# Patient Record
Sex: Female | Born: 1950
Health system: Southern US, Community
[De-identification: ages and names within clinical notes are randomized; demographics above are authoritative.]

## PROBLEM LIST (undated history)

## (undated) DIAGNOSIS — I10 Essential (primary) hypertension: Secondary | ICD-10-CM

## (undated) DIAGNOSIS — F32A Depression, unspecified: Secondary | ICD-10-CM

## (undated) DIAGNOSIS — F329 Major depressive disorder, single episode, unspecified: Secondary | ICD-10-CM

## (undated) DIAGNOSIS — D649 Anemia, unspecified: Secondary | ICD-10-CM

## (undated) DIAGNOSIS — D0510 Intraductal carcinoma in situ of unspecified breast: Secondary | ICD-10-CM

## (undated) DIAGNOSIS — C801 Malignant (primary) neoplasm, unspecified: Secondary | ICD-10-CM

## (undated) DIAGNOSIS — G629 Polyneuropathy, unspecified: Secondary | ICD-10-CM

## (undated) DIAGNOSIS — Z780 Asymptomatic menopausal state: Secondary | ICD-10-CM

## (undated) DIAGNOSIS — C50919 Malignant neoplasm of unspecified site of unspecified female breast: Secondary | ICD-10-CM

## (undated) DIAGNOSIS — K219 Gastro-esophageal reflux disease without esophagitis: Secondary | ICD-10-CM

## (undated) DIAGNOSIS — I341 Nonrheumatic mitral (valve) prolapse: Secondary | ICD-10-CM

## (undated) HISTORY — DX: Intraductal carcinoma in situ of unspecified breast: D05.10

## (undated) HISTORY — PX: BUNIONECTOMY: SHX129

## (undated) HISTORY — DX: Depression, unspecified: F32.A

## (undated) HISTORY — DX: Nonrheumatic mitral (valve) prolapse: I34.1

## (undated) HISTORY — DX: Major depressive disorder, single episode, unspecified: F32.9

## (undated) HISTORY — PX: ABDOMINAL HYSTERECTOMY: SHX81

## (undated) HISTORY — PX: GANGLION CYST EXCISION: SHX1691

## (undated) HISTORY — DX: Asymptomatic menopausal state: Z78.0

## (undated) HISTORY — DX: Essential (primary) hypertension: I10

---

## 2005-06-08 ENCOUNTER — Ambulatory Visit: Payer: Self-pay | Admitting: Internal Medicine

## 2006-05-29 ENCOUNTER — Ambulatory Visit: Payer: Self-pay | Admitting: Gastroenterology

## 2006-07-05 ENCOUNTER — Ambulatory Visit: Payer: Self-pay | Admitting: Internal Medicine

## 2007-09-12 ENCOUNTER — Ambulatory Visit: Payer: Self-pay | Admitting: Internal Medicine

## 2007-12-03 ENCOUNTER — Ambulatory Visit: Payer: Self-pay | Admitting: Gastroenterology

## 2007-12-04 ENCOUNTER — Ambulatory Visit: Payer: Self-pay | Admitting: Gastroenterology

## 2012-08-08 ENCOUNTER — Ambulatory Visit: Payer: Self-pay

## 2013-01-01 ENCOUNTER — Ambulatory Visit: Payer: Self-pay | Admitting: Gastroenterology

## 2013-12-10 DIAGNOSIS — I10 Essential (primary) hypertension: Secondary | ICD-10-CM | POA: Insufficient documentation

## 2013-12-10 DIAGNOSIS — K219 Gastro-esophageal reflux disease without esophagitis: Secondary | ICD-10-CM | POA: Insufficient documentation

## 2013-12-10 DIAGNOSIS — F3340 Major depressive disorder, recurrent, in remission, unspecified: Secondary | ICD-10-CM | POA: Insufficient documentation

## 2013-12-10 DIAGNOSIS — E669 Obesity, unspecified: Secondary | ICD-10-CM | POA: Insufficient documentation

## 2014-10-06 DEATH — deceased

## 2015-03-08 DIAGNOSIS — C50919 Malignant neoplasm of unspecified site of unspecified female breast: Secondary | ICD-10-CM

## 2015-03-08 HISTORY — DX: Malignant neoplasm of unspecified site of unspecified female breast: C50.919

## 2015-07-07 ENCOUNTER — Other Ambulatory Visit: Payer: Self-pay | Admitting: Family Medicine

## 2015-07-07 DIAGNOSIS — Z1231 Encounter for screening mammogram for malignant neoplasm of breast: Secondary | ICD-10-CM

## 2015-07-07 DIAGNOSIS — M5412 Radiculopathy, cervical region: Secondary | ICD-10-CM

## 2015-07-09 ENCOUNTER — Other Ambulatory Visit: Payer: Self-pay | Admitting: Family Medicine

## 2015-07-09 ENCOUNTER — Ambulatory Visit
Admission: RE | Admit: 2015-07-09 | Discharge: 2015-07-09 | Disposition: A | Discharge status: Home / Self Care | Payer: Medicare Other | Source: Ambulatory Visit | Attending: Family Medicine | Admitting: Family Medicine

## 2015-07-09 DIAGNOSIS — Z1231 Encounter for screening mammogram for malignant neoplasm of breast: Secondary | ICD-10-CM

## 2015-07-15 ENCOUNTER — Other Ambulatory Visit: Payer: Self-pay | Admitting: Family Medicine

## 2015-07-15 DIAGNOSIS — R928 Other abnormal and inconclusive findings on diagnostic imaging of breast: Secondary | ICD-10-CM

## 2015-07-23 ENCOUNTER — Ambulatory Visit
Admission: RE | Admit: 2015-07-23 | Discharge: 2015-07-23 | Disposition: A | Discharge status: Home / Self Care | Payer: Medicare Other | Source: Ambulatory Visit | Attending: Family Medicine | Admitting: Family Medicine

## 2015-07-23 DIAGNOSIS — M5412 Radiculopathy, cervical region: Secondary | ICD-10-CM | POA: Insufficient documentation

## 2015-07-23 DIAGNOSIS — M50322 Other cervical disc degeneration at C5-C6 level: Secondary | ICD-10-CM | POA: Insufficient documentation

## 2015-07-23 DIAGNOSIS — M50321 Other cervical disc degeneration at C4-C5 level: Secondary | ICD-10-CM | POA: Insufficient documentation

## 2015-07-23 DIAGNOSIS — M50323 Other cervical disc degeneration at C6-C7 level: Secondary | ICD-10-CM | POA: Insufficient documentation

## 2015-07-23 DIAGNOSIS — M5030 Other cervical disc degeneration, unspecified cervical region: Secondary | ICD-10-CM | POA: Insufficient documentation

## 2015-07-23 DIAGNOSIS — M47892 Other spondylosis, cervical region: Secondary | ICD-10-CM | POA: Diagnosis not present

## 2015-07-24 ENCOUNTER — Other Ambulatory Visit: Payer: Self-pay | Admitting: Family Medicine

## 2015-07-24 ENCOUNTER — Ambulatory Visit
Admission: RE | Admit: 2015-07-24 | Discharge: 2015-07-24 | Disposition: A | Discharge status: Home / Self Care | Payer: Medicare Other | Source: Ambulatory Visit | Attending: Family Medicine | Admitting: Family Medicine

## 2015-07-24 DIAGNOSIS — R928 Other abnormal and inconclusive findings on diagnostic imaging of breast: Secondary | ICD-10-CM

## 2015-07-24 DIAGNOSIS — R921 Mammographic calcification found on diagnostic imaging of breast: Secondary | ICD-10-CM | POA: Insufficient documentation

## 2015-07-28 ENCOUNTER — Other Ambulatory Visit: Payer: Self-pay | Admitting: Family Medicine

## 2015-07-28 DIAGNOSIS — R921 Mammographic calcification found on diagnostic imaging of breast: Secondary | ICD-10-CM

## 2015-08-04 ENCOUNTER — Ambulatory Visit
Admission: RE | Admit: 2015-08-04 | Discharge: 2015-08-04 | Disposition: A | Discharge status: Home / Self Care | Payer: Medicare Other | Source: Ambulatory Visit | Attending: Family Medicine | Admitting: Family Medicine

## 2015-08-04 DIAGNOSIS — D0510 Intraductal carcinoma in situ of unspecified breast: Secondary | ICD-10-CM | POA: Insufficient documentation

## 2015-08-04 DIAGNOSIS — D0511 Intraductal carcinoma in situ of right breast: Secondary | ICD-10-CM | POA: Insufficient documentation

## 2015-08-04 DIAGNOSIS — R921 Mammographic calcification found on diagnostic imaging of breast: Secondary | ICD-10-CM | POA: Diagnosis present

## 2015-08-05 DIAGNOSIS — C801 Malignant (primary) neoplasm, unspecified: Secondary | ICD-10-CM

## 2015-08-05 HISTORY — DX: Malignant (primary) neoplasm, unspecified: C80.1

## 2015-08-10 ENCOUNTER — Encounter: Payer: Self-pay | Admitting: *Deleted

## 2015-08-10 HISTORY — PX: BREAST BIOPSY: SHX20

## 2015-08-10 NOTE — Progress Notes (Signed)
  Oncology Nurse Navigator Documentation  Navigator Location: [facility]-Med Onc (08/10/15 1600) Navigator Encounter Type: Introductory phone call (08/10/15 1600)   Abnormal Finding Date: 07/24/15 (08/10/15 1600) Confirmed Diagnosis Date: 08/07/15 (08/10/15 1600)         Barriers/Navigation Needs: No barriers at this time (08/10/15 1600)                          Time Spent with Patient: 15 (08/10/15 1600)   Called and introduced patient to navigation services.  She has an appointment with Dr. Grayland Ormond tomorrow at 1:30.  Explained that Webb Silversmith or myself will meet with her tomorrow.  She is agreeable.

## 2015-08-11 ENCOUNTER — Inpatient Hospital Stay: Payer: Medicare Other | Attending: Oncology | Admitting: Oncology

## 2015-08-11 ENCOUNTER — Encounter: Payer: Self-pay | Admitting: Oncology

## 2015-08-11 VITALS — BP 138/75 | HR 55 | Resp 18 | Ht 63.78 in | Wt 201.5 lb

## 2015-08-11 DIAGNOSIS — Z803 Family history of malignant neoplasm of breast: Secondary | ICD-10-CM | POA: Insufficient documentation

## 2015-08-11 DIAGNOSIS — Z800 Family history of malignant neoplasm of digestive organs: Secondary | ICD-10-CM | POA: Insufficient documentation

## 2015-08-11 DIAGNOSIS — I10 Essential (primary) hypertension: Secondary | ICD-10-CM

## 2015-08-11 DIAGNOSIS — Z170 Estrogen receptor positive status [ER+]: Secondary | ICD-10-CM | POA: Diagnosis not present

## 2015-08-11 DIAGNOSIS — Z7982 Long term (current) use of aspirin: Secondary | ICD-10-CM

## 2015-08-11 DIAGNOSIS — I341 Nonrheumatic mitral (valve) prolapse: Secondary | ICD-10-CM | POA: Diagnosis not present

## 2015-08-11 DIAGNOSIS — Z90710 Acquired absence of both cervix and uterus: Secondary | ICD-10-CM

## 2015-08-11 DIAGNOSIS — D0511 Intraductal carcinoma in situ of right breast: Secondary | ICD-10-CM | POA: Diagnosis not present

## 2015-08-11 DIAGNOSIS — Z79899 Other long term (current) drug therapy: Secondary | ICD-10-CM | POA: Insufficient documentation

## 2015-08-11 DIAGNOSIS — D0510 Intraductal carcinoma in situ of unspecified breast: Secondary | ICD-10-CM

## 2015-08-11 DIAGNOSIS — F329 Major depressive disorder, single episode, unspecified: Secondary | ICD-10-CM | POA: Diagnosis not present

## 2015-08-11 NOTE — Progress Notes (Signed)
  Oncology Nurse Navigator Documentation  Navigator Location: [facility] Onc (08/11/15 1600) Navigator Encounter Type: Initial MedOnc (08/11/15 1600)           Patient Visit Type: Initial;MedOnc (08/11/15 1600) Treatment Phase: Pre-Tx/Tx Discussion (08/11/15 1600) Barriers/Navigation Needs: Coordination of Care;Education (08/11/15 1600) Education: Accessing Care/ Finding Providers;Newly Diagnosed Cancer Education;Preparing for Upcoming Surgery/ Treatment;Understanding Cancer/ Treatment Options (08/11/15 1600) Interventions: Coordination of Care;Education Method (08/11/15 1600)   Coordination of Care: Appts (08/11/15 1600) Education Method: Written;Verbal;Teach-back (08/11/15 1600)  Support Groups/Services: Breast Support Group (08/11/15 1600)   Acuity: Level 2 (08/11/15 1600)   Acuity Level 2: Initial guidance, education and coordination as needed;Assistance expediting appointments (08/11/15 1600)     Time Spent with Patient: 60 (08/11/15 1600)   Met patient, family at initial Medical Oncology visit.  Reviewed navigation, and Breast Cancer Treatment Handbook as a resource.  Explained patient services offered through [facility].  Scheduled consult appointment with Dr. Tamala Julian for Thursday August 13, 2015 .  Patient is to arrive at 2:45 p.m. For a 3:00 appointment.

## 2015-08-14 ENCOUNTER — Other Ambulatory Visit: Payer: Self-pay | Admitting: Surgery

## 2015-08-14 DIAGNOSIS — D0511 Intraductal carcinoma in situ of right breast: Secondary | ICD-10-CM

## 2015-08-16 DIAGNOSIS — D0511 Intraductal carcinoma in situ of right breast: Secondary | ICD-10-CM

## 2015-08-16 NOTE — Progress Notes (Signed)
Beth Bender  [phone] [phone]  ID: Richardine Service OB: [birth year]  MR#: [id number]  [id number]  Patient Care Team: Dion Body, MD as PCP - General (Family Medicine)  CHIEF COMPLAINT:  Chief Complaint  Patient presents with  . New Evaluation    DCIS of right breast    INTERVAL HISTORY: Patient is a 65 year old female as noted to have an abnormality on routine screening mammogram. Subsequent biopsy revealed right breast DCIS approximately 2.3 cm in length. Currently, she feels well and is asymptomatic. She has no neurologic complaints. She denies any ears or illnesses. She denies chest pain or shortness of breath. She has good appetite and denies weight loss. She has no nausea, vomiting, constipation, or diarrhea. She has no urinary complaints. Patient feels at her baseline and offers no specific complaints today.  REVIEW OF SYSTEMS:   Review of Systems  Constitutional: Negative for fever, weight loss and malaise/fatigue.  Respiratory: Negative.  Negative for cough and shortness of breath.   Cardiovascular: Negative.  Negative for chest pain.  Gastrointestinal: Negative.  Negative for abdominal pain.  Genitourinary: Negative.   Neurological: Negative.  Negative for weakness.  Psychiatric/Behavioral: Negative.     As per HPI. Otherwise, a complete review of systems is negatve.  PAST MEDICAL HISTORY: Past Medical History  Diagnosis Date  . Hypertension   . Mitral valve prolapse   . Depression   . DCIS (ductal carcinoma in situ) of breast   . Post-menopausal     PAST SURGICAL HISTORY: Past Surgical History  Procedure Laterality Date  . Abdominal hysterectomy      FAMILY HISTORY Family History  Problem Relation Age of Onset  . Breast cancer Maternal Aunt 90  . Colon cancer Maternal Uncle 69  . Cancer - Other Mother 29    sinus       ADVANCED DIRECTIVES:    HEALTH MAINTENANCE: Social History    Substance Use Topics  . Smoking status: Never Smoker   . Smokeless tobacco: Never Used  . Alcohol Use: No     Colonoscopy:  PAP:  Bone density:  Lipid panel:  No Known Allergies  Current Outpatient Prescriptions  Medication Sig Dispense Refill  . aspirin EC 81 MG tablet Take 81 mg by mouth daily.     . Cholecalciferol (VITAMIN D3) 2000 units capsule Take 2,000 Units by mouth daily.     . metoprolol succinate (TOPROL-XL) 25 MG 24 hr tablet Take 25 mg by mouth daily.     Marland Kitchen triamterene-hydrochlorothiazide (DYAZIDE) 37.5-25 MG capsule Take 1 capsule by mouth daily.      No current facility-administered medications for this visit.    OBJECTIVE: Filed Vitals:   08/11/15 1335  BP: 138/75  Pulse: 55  Resp: 18     Body mass index is 34.83 kg/(m^2).    ECOG FS:0 - Asymptomatic  General: Well-developed, well-nourished, no acute distress. Eyes: Pink conjunctiva, anicteric sclera. HEENT: Normocephalic, moist mucous membranes, clear oropharnyx. Breasts: No palpable abnormality. Lungs: Clear to auscultation bilaterally. Heart: Regular rate and rhythm. No rubs, murmurs, or gallops. Abdomen: Soft, nontender, nondistended. No organomegaly noted, normoactive bowel sounds. Musculoskeletal: No edema, cyanosis, or clubbing. Neuro: Alert, answering all questions appropriately. Cranial nerves grossly intact. Skin: No rashes or petechiae noted. Psych: Normal affect. Lymphatics: No cervical, calvicular, axillary or inguinal LAD.   LAB RESULTS:  No results found for: NA, K, CL, CO2, GLUCOSE, BUN, CREATININE, CALCIUM, PROT, ALBUMIN, AST, ALT, ALKPHOS, BILITOT, GFRNONAA, GFRAA  No  results found for: WBC, NEUTROABS, HGB, HCT, MCV, PLT   STUDIES: Mr Cervical Spine Wo Contrast  07/23/2015  CLINICAL DATA:  Neck pain and numbness with tingling in the right hand and pain extending to the right shoulder. Symptoms for 1 year. EXAM: MRI CERVICAL SPINE WITHOUT CONTRAST TECHNIQUE: Multiplanar,  multisequence MR imaging of the cervical spine was performed. No intravenous contrast was administered. COMPARISON:  Report from 06/18/2015 FINDINGS: Brainstem and visualize cerebellum unremarkable. There is prepontine signal differing from surrounding CSF at and to the left of midline for example on images 8-10 of series 6 along the dorsum sella on all sequences. This could represent pulsation artifact or a mass. No obvious destructive lesion of the visualized portion of the clivus or dorsum sella identified. This potential abnormality is not fully included on today's exam. There straightening of the normal cervical lordosis. Despite efforts by the technologist and patient, motion artifact is present on today's exam and could not be eliminated. This reduces exam sensitivity and specificity. No significant abnormal spinal cord signal is observed. No significant vertebral marrow edema is identified. No vertebral subluxation is observed. Additional findings at individual levels are as follows: C2-3:  No impingement.  Shallow central disc protrusion. C3-4:  Unremarkable. C4-5:  No impingement.  Shallow central disc protrusion. C5-6: Borderline right foraminal stenosis and borderline right eccentric central narrowing of the thecal sac due to right paracentral disc protrusion, uncinate spurring, and mild disc bulge. C6-7:  No impingement.  Mild disc bulge. C7-T1:  Unremarkable. IMPRESSION: 1. No overt impingement, although there is borderline right central narrowing of the thecal sac and borderline right foraminal narrowing at C5-6 due to spondylosis and degenerative disc disease. 2. At the midline and to the left of midline, there is signal heterogeneity in the pre pontine cistern behind the dorsum sellae. Although this could be from pulsation artifact, I can't exclude a mass. Admittedly this is a subtle finding, and not fully included on today's exam, but I would recommend brain MRI with and without contrast for  definitive characterization to exclude malignancy. These results will be called to the ordering clinician or representative by the Radiologist Assistant, and communication documented in the PACS or zVision Dashboard. Electronically Signed   By: Van Clines M.D.   On: 07/23/2015 09:55   Mm Digital Diagnostic Bilat  07/24/2015  CLINICAL DATA:  Screening recall for right breast calcifications and bilateral prominent axillary lymph nodes. EXAM: 2D DIGITAL DIAGNOSTIC BILATERAL MAMMOGRAM WITH CAD AND ADJUNCT TOMO BILATERAL AXILLA ULTRASOUND COMPARISON:  Previous exam(s). ACR Breast Density Category b: There are scattered areas of fibroglandular density. FINDINGS: Spot compression magnification views were performed over the central upper right breast demonstrating a suspicious group of branching microcalcifications varying in shape size and density, and spanning a distance of approximately 2.3 cm. Mammographic images were processed with CAD. Targeted ultrasound of the bilateral axilla was performed. Bilateral symmetric appearing axillary lymph nodes are seen, all of which contain an echogenic/fatty hila. No suspicious/ pathologic axillary lymphadenopathy identified. A representative lymph node in the right axilla measures 2.7 x 1.2 x 1.3 cm. A representative lymph node in the left axilla measures 2.3 x 1 x 1 cm. IMPRESSION: 1. Suspicious right breast calcifications. 2.  Normal appearing bilateral axillary lymph nodes. RECOMMENDATION: Stereotactic guided biopsy of the suspicious calcifications in the upper central right breast is recommended. This is being scheduled for the patient. I have discussed the findings and recommendations with the patient. Results were also provided in writing at  the conclusion of the visit. If applicable, a reminder letter will be sent to the patient regarding the next appointment. BI-RADS CATEGORY  4: Suspicious. Electronically Signed   By: Everlean Alstrom M.D.   On: 07/24/2015 10:56     Mm Digital Diagnostic Unilat R  08/04/2015  CLINICAL DATA:  Post biopsy mammogram of the right breast for clip placement. EXAM: DIAGNOSTIC RIGHT MAMMOGRAM POST STEREOTACTIC BIOPSY COMPARISON:  Previous exam(s). FINDINGS: Mammographic images were obtained following stereotactic guided biopsy of calcifications in the upper-outer right breast. The top hat shaped biopsy marking clip is approximately 5 mm superior to the the site biopsied calcifications in the upper-outer right breast. A few faint calcifications remain at the site. IMPRESSION: 5 mm of superior displacement of the top hat shaped biopsy marking clip to the calcifications in the upper-outer right breast. Final Assessment: Post Procedure Mammograms for Marker Placement Electronically Signed   By: Ammie Ferrier M.D.   On: 08/04/2015 15:01   US Breast Ltd Uni Left Inc Axilla  07/24/2015  CLINICAL DATA:  Screening recall for right breast calcifications and bilateral prominent axillary lymph nodes. EXAM: 2D DIGITAL DIAGNOSTIC BILATERAL MAMMOGRAM WITH CAD AND ADJUNCT TOMO BILATERAL AXILLA ULTRASOUND COMPARISON:  Previous exam(s). ACR Breast Density Category b: There are scattered areas of fibroglandular density. FINDINGS: Spot compression magnification views were performed over the central upper right breast demonstrating a suspicious group of branching microcalcifications varying in shape size and density, and spanning a distance of approximately 2.3 cm. Mammographic images were processed with CAD. Targeted ultrasound of the bilateral axilla was performed. Bilateral symmetric appearing axillary lymph nodes are seen, all of which contain an echogenic/fatty hila. No suspicious/ pathologic axillary lymphadenopathy identified. A representative lymph node in the right axilla measures 2.7 x 1.2 x 1.3 cm. A representative lymph node in the left axilla measures 2.3 x 1 x 1 cm. IMPRESSION: 1. Suspicious right breast calcifications. 2.  Normal appearing  bilateral axillary lymph nodes. RECOMMENDATION: Stereotactic guided biopsy of the suspicious calcifications in the upper central right breast is recommended. This is being scheduled for the patient. I have discussed the findings and recommendations with the patient. Results were also provided in writing at the conclusion of the visit. If applicable, a reminder letter will be sent to the patient regarding the next appointment. BI-RADS CATEGORY  4: Suspicious. Electronically Signed   By: Everlean Alstrom M.D.   On: 07/24/2015 10:56   US Breast Ltd Uni Right Inc Axilla  07/24/2015  CLINICAL DATA:  Screening recall for right breast calcifications and bilateral prominent axillary lymph nodes. EXAM: 2D DIGITAL DIAGNOSTIC BILATERAL MAMMOGRAM WITH CAD AND ADJUNCT TOMO BILATERAL AXILLA ULTRASOUND COMPARISON:  Previous exam(s). ACR Breast Density Category b: There are scattered areas of fibroglandular density. FINDINGS: Spot compression magnification views were performed over the central upper right breast demonstrating a suspicious group of branching microcalcifications varying in shape size and density, and spanning a distance of approximately 2.3 cm. Mammographic images were processed with CAD. Targeted ultrasound of the bilateral axilla was performed. Bilateral symmetric appearing axillary lymph nodes are seen, all of which contain an echogenic/fatty hila. No suspicious/ pathologic axillary lymphadenopathy identified. A representative lymph node in the right axilla measures 2.7 x 1.2 x 1.3 cm. A representative lymph node in the left axilla measures 2.3 x 1 x 1 cm. IMPRESSION: 1. Suspicious right breast calcifications. 2.  Normal appearing bilateral axillary lymph nodes. RECOMMENDATION: Stereotactic guided biopsy of the suspicious calcifications in the upper central right breast  is recommended. This is being scheduled for the patient. I have discussed the findings and recommendations with the patient. Results were also  provided in writing at the conclusion of the visit. If applicable, a reminder letter will be sent to the patient regarding the next appointment. BI-RADS CATEGORY  4: Suspicious. Electronically Signed   By: Everlean Alstrom M.D.   On: 07/24/2015 10:56   Mm Rt Breast Bx W Loc Dev 1st Lesion Image Bx Spec Stereo Guide  08/10/2015  ADDENDUM REPORT: 08/10/2015 12:53 ADDENDUM: PATHOLOGY: DCIS, nuclear grade 2. CONCORDANT: Yes I telephoned the patient on 08/10/2015 at 12:52 p.m. and discussed these results and the recommendations stated below. All questions were answered. The patient denies significant pain or bleeding from the biopsy site. Biopsy site care instructions were reviewed and the patient was asked to call [facility] with any questions or issues related to the biopsy. RECOMMENDATION: Followup as per clinical treatment plan as patient has an appointment to see the oncologist, Dr. Grayland Ormond, tomorrow at 1:30 p.m. Electronically Signed   By: Marin Olp M.D.   On: 08/10/2015 12:53  08/10/2015  CLINICAL DATA:  65 year old female presenting for stereotactic biopsy of right breast calcifications. EXAM: RIGHT BREAST STEREOTACTIC CORE NEEDLE BIOPSY COMPARISON:  Previous exams. FINDINGS: The patient and I discussed the procedure of stereotactic-guided biopsy including benefits and alternatives. We discussed the high likelihood of a successful procedure. We discussed the risks of the procedure including infection, bleeding, tissue injury, clip migration, and inadequate sampling. Informed written consent was given. The usual time out protocol was performed immediately prior to the procedure. Using sterile technique and 1% Lidocaine as local anesthetic, under stereotactic guidance, a 9 gauge vacuum assisted device was used to perform core needle biopsy of calcifications in the upper-outer quadrant of the right breast using a superior approach. Specimen radiograph was performed showing calcifications within  multiple core samples. Specimens with calcifications are identified for pathology. At the conclusion of the procedure, a top hat shaped tissue marker clip was deployed into the biopsy cavity. Follow-up 2-view mammogram was performed and dictated separately. IMPRESSION: Stereotactic-guided biopsy of calcifications in the upper-outer right breast. No apparent complications. Electronically Signed: By: Ammie Ferrier M.D. On: 08/04/2015 14:49    ASSESSMENT: Right upper outer breast DCIS  PLAN:    1. DCIS: Pathology results reviewed independently without invasive component. A referral was made to surgery for consideration of lumpectomy. Given the size of the area of DCIS, sentinel node biopsy was also recommended. Patient will return to clinic in 1-2 weeks after her surgery to discuss the final pathology results. If she has an invasive component, will discuss chemotherapy at that time. Patient will benefit from adjuvant XRT. Ultimately, patient will also require tamoxifen since the majority of DCIS are ER/PR positive.  Approximately 45 minutes was spent in discussion of which greater than 50% was consultation.  Patient expressed understanding and was in agreement with this plan. She also understands that She can call clinic at any time with any questions, concerns, or complaints.   Ductal carcinoma in situ (DCIS) of right breast   Staging form: Breast, AJCC 7th Edition     Clinical stage from 08/16/2015: Stage 0 (Tis (DCIS), N0, M0) - Signed by Lloyd Huger, MD on 08/16/2015   Lloyd Huger, MD   08/16/2015 9:04 AM

## 2015-08-17 NOTE — Progress Notes (Signed)
  Oncology Nurse Navigator Documentation  Navigator Location: [facility] Onc (08/17/15 1600) Navigator Encounter Type: Telephone;MDC Follow-up (08/17/15 1600)         Treatment Initiated Date: 08/27/15 (08/17/15 1600) Patient Visit Type: Follow-up (08/17/15 1600) Treatment Phase: Pre-Tx/Tx Discussion (08/17/15 1600) Barriers/Navigation Needs: Coordination of Care;No Questions (08/17/15 1600)   Interventions: Coordination of Care (08/17/15 1600)   Coordination of Care: Appts (08/17/15 1600)        Acuity: Level 2 (08/17/15 1600)   Acuity Level 2: Educational needs;Assistance expediting appointments;Ongoing guidance and education throughout treatment as needed (08/17/15 1600)     Time Spent with Patient: 30 (08/17/15 1600)   Phoned patient as follow-up surgical consult.  She is scheduled for pre-admit testing tomorrow, and for surgery 08/27/15.  Her follow-up with Dr. Grayland Ormond is 09/09/15.  Instructed on  location of Pre-Admit testing.  Plan to meet day of surgery.

## 2015-08-18 ENCOUNTER — Encounter
Admission: RE | Admit: 2015-08-18 | Discharge: 2015-08-18 | Disposition: A | Discharge status: Home / Self Care | Payer: Medicare Other | Source: Ambulatory Visit | Attending: Surgery | Admitting: Surgery

## 2015-08-18 DIAGNOSIS — Z01812 Encounter for preprocedural laboratory examination: Secondary | ICD-10-CM | POA: Diagnosis not present

## 2015-08-18 DIAGNOSIS — Z01810 Encounter for preprocedural cardiovascular examination: Secondary | ICD-10-CM | POA: Insufficient documentation

## 2015-08-18 HISTORY — DX: Polyneuropathy, unspecified: G62.9

## 2015-08-18 HISTORY — DX: Gastro-esophageal reflux disease without esophagitis: K21.9

## 2015-08-18 HISTORY — DX: Malignant (primary) neoplasm, unspecified: C80.1

## 2015-08-18 HISTORY — DX: Anemia, unspecified: D64.9

## 2015-08-18 LAB — BASIC METABOLIC PANEL
Anion gap: 10 (ref 5–15)
BUN: 19 mg/dL (ref 6–20)
CHLORIDE: 103 mmol/L (ref 101–111)
CO2: 27 mmol/L (ref 22–32)
Calcium: 9.6 mg/dL (ref 8.9–10.3)
Creatinine, Ser: 0.98 mg/dL (ref 0.44–1.00)
GFR calc Af Amer: >60 mL/min (ref >60)
GFR calc non Af Amer: 59 mL/min — ABNORMAL LOW (ref >60)
GLUCOSE: 85 mg/dL (ref 65–99)
POTASSIUM: 3.1 mmol/L — AB (ref 3.5–5.1)
Sodium: 140 mmol/L (ref 135–145)

## 2015-08-18 LAB — CBC
HEMATOCRIT: 36 % (ref 35.0–47.0)
Hemoglobin: 11.7 g/dL — ABNORMAL LOW (ref 12.0–16.0)
MCH: 26.8 pg (ref 26.0–34.0)
MCHC: 32.4 g/dL (ref 32.0–36.0)
MCV: 82.7 fL (ref 80.0–100.0)
Platelets: 172 10*3/uL (ref 150–440)
RBC: 4.35 MIL/uL (ref 3.80–5.20)
RDW: 13.9 % (ref 11.5–14.5)
WBC: 5.4 10*3/uL (ref 3.6–11.0)

## 2015-08-18 NOTE — Pre-Procedure Instructions (Signed)
Today's EKG compared to EKG done 2011, shown to [employer], [place] to proceed.

## 2015-08-18 NOTE — Patient Instructions (Signed)
  Your procedure is scheduled on: August 27, 2015. Report to Nuclear Medicine at 8:00am.   Remember: Instructions that are not followed completely may result in serious medical risk, up to and including death, or upon the discretion of your surgeon and anesthesiologist your surgery may need to be rescheduled.    _x___ 1. Do not eat food or drink liquids after midnight. No gum chewing or hard candies.     ____ 2. No Alcohol for 24 hours before or after surgery.   ____ 3. Bring all medications with you on the day of surgery if instructed.    __x__ 4. Notify your doctor if there is any change in your medical condition     (cold, fever, infections).     Do not wear jewelry, make-up, hairpins, clips or nail polish.  Do not wear lotions, powders, or perfumes. You may wear deodorant.  Do not shave 48 hours prior to surgery. Men may shave face and neck.  Do not bring valuables to the hospital.    [facility] is not responsible for any belongings or valuables.               Contacts, dentures or bridgework may not be worn into surgery.  Leave your suitcase in the car. After surgery it may be brought to your room.  For patients admitted to the hospital, discharge time is determined by your treatment team.   Patients discharged the day of surgery will not be allowed to drive home.    Please read over the following fact sheets that you were given:   [facility] Preparing for Surgery  __x__ Take these medicines the morning of surgery with A SIP OF WATER:    1.metoprolol succinate (TOPROL-XL)     ____ Fleet Enema (as directed)   _x___ Use CHG Soap as directed on instruction sheet  ____ Use inhalers on the day of surgery and bring to hospital day of surgery  ____ Stop metformin 2 days prior to surgery    ____ Take 1/2 of usual insulin dose the night before surgery and none on the morning of surgery.   _x___ Stop aspirin on August 20, 2015 per Dr. Tamala Julian instruction.  _x___ Stop  Anti-inflammatories such as Advil, Aleve, Ibuprofen, Motrin, Naproxen, Naprosyn, Goodies powders or aspirin products. OK to take Tylenol.   _x___ Stop supplements:Cholecalciferol (VITAMIN D3)  until after surgery.    ____ Bring C-Pap to the hospital.

## 2015-08-19 NOTE — Pre-Procedure Instructions (Signed)
Labs with Kt 3.1 faxed to dr w Tamala Julian

## 2015-08-27 ENCOUNTER — Encounter: Payer: Self-pay | Admitting: *Deleted

## 2015-08-27 ENCOUNTER — Encounter
Admission: RE | Disposition: A | Discharge status: Home / Self Care | Payer: Self-pay | Source: Ambulatory Visit | Attending: Surgery

## 2015-08-27 ENCOUNTER — Observation Stay
Admission: RE | Admit: 2015-08-27 | Discharge: 2015-08-28 | Disposition: A | Discharge status: Home / Self Care | Payer: Medicare Other | Source: Ambulatory Visit | Attending: Surgery | Admitting: Surgery

## 2015-08-27 ENCOUNTER — Ambulatory Visit: Payer: Medicare Other | Admitting: Registered Nurse

## 2015-08-27 ENCOUNTER — Encounter
Admission: RE | Admit: 2015-08-27 | Discharge: 2015-08-27 | Disposition: A | Discharge status: Home / Self Care | Payer: Medicare Other | Source: Ambulatory Visit | Attending: Surgery | Admitting: Surgery

## 2015-08-27 DIAGNOSIS — Z79899 Other long term (current) drug therapy: Secondary | ICD-10-CM | POA: Insufficient documentation

## 2015-08-27 DIAGNOSIS — Z90710 Acquired absence of both cervix and uterus: Secondary | ICD-10-CM | POA: Diagnosis not present

## 2015-08-27 DIAGNOSIS — I10 Essential (primary) hypertension: Secondary | ICD-10-CM | POA: Diagnosis not present

## 2015-08-27 DIAGNOSIS — Z8371 Family history of colonic polyps: Secondary | ICD-10-CM | POA: Insufficient documentation

## 2015-08-27 DIAGNOSIS — Z811 Family history of alcohol abuse and dependence: Secondary | ICD-10-CM | POA: Diagnosis not present

## 2015-08-27 DIAGNOSIS — K219 Gastro-esophageal reflux disease without esophagitis: Secondary | ICD-10-CM | POA: Diagnosis not present

## 2015-08-27 DIAGNOSIS — Z8261 Family history of arthritis: Secondary | ICD-10-CM | POA: Insufficient documentation

## 2015-08-27 DIAGNOSIS — Z7982 Long term (current) use of aspirin: Secondary | ICD-10-CM | POA: Diagnosis not present

## 2015-08-27 DIAGNOSIS — M1990 Unspecified osteoarthritis, unspecified site: Secondary | ICD-10-CM | POA: Insufficient documentation

## 2015-08-27 DIAGNOSIS — N6011 Diffuse cystic mastopathy of right breast: Secondary | ICD-10-CM | POA: Diagnosis not present

## 2015-08-27 DIAGNOSIS — Z833 Family history of diabetes mellitus: Secondary | ICD-10-CM | POA: Diagnosis not present

## 2015-08-27 DIAGNOSIS — Z803 Family history of malignant neoplasm of breast: Secondary | ICD-10-CM | POA: Diagnosis not present

## 2015-08-27 DIAGNOSIS — E7800 Pure hypercholesterolemia, unspecified: Secondary | ICD-10-CM | POA: Diagnosis not present

## 2015-08-27 DIAGNOSIS — Z8249 Family history of ischemic heart disease and other diseases of the circulatory system: Secondary | ICD-10-CM | POA: Insufficient documentation

## 2015-08-27 DIAGNOSIS — C50911 Malignant neoplasm of unspecified site of right female breast: Secondary | ICD-10-CM | POA: Diagnosis present

## 2015-08-27 DIAGNOSIS — F329 Major depressive disorder, single episode, unspecified: Secondary | ICD-10-CM | POA: Diagnosis not present

## 2015-08-27 DIAGNOSIS — Z9889 Other specified postprocedural states: Secondary | ICD-10-CM | POA: Diagnosis not present

## 2015-08-27 DIAGNOSIS — D0511 Intraductal carcinoma in situ of right breast: Secondary | ICD-10-CM

## 2015-08-27 HISTORY — PX: SIMPLE MASTECTOMY WITH AXILLARY SENTINEL NODE BIOPSY: SHX6098

## 2015-08-27 HISTORY — PX: MASTECTOMY: SHX3

## 2015-08-27 HISTORY — PX: MASTECTOMY MODIFIED RADICAL: SHX5962

## 2015-08-27 LAB — POCT I-STAT 4, (NA,K, GLUC, HGB,HCT)
GLUCOSE: 115 mg/dL — AB (ref 65–99)
HEMATOCRIT: 34 % — AB (ref 36.0–46.0)
HEMOGLOBIN: 11.6 g/dL — AB (ref 12.0–15.0)
Potassium: 3.4 mmol/L — ABNORMAL LOW (ref 3.5–5.1)
SODIUM: 142 mmol/L (ref 135–145)

## 2015-08-27 SURGERY — SIMPLE MASTECTOMY WITH AXILLARY SENTINEL NODE BIOPSY
Anesthesia: General | Site: Breast | Laterality: Right | Wound class: Clean

## 2015-08-27 MED ORDER — MORPHINE SULFATE (PF) 2 MG/ML IV SOLN
1.0000 mg | INTRAVENOUS | Status: DC | PRN
  Administered 2015-08-27: 1 mg via INTRAVENOUS
  Administered 2015-08-27: 2 mg via INTRAVENOUS
  Administered 2015-08-27: 1 mg via INTRAVENOUS
  Administered 2015-08-28: 2 mg via INTRAVENOUS
  Filled 2015-08-27 (×4): qty 1

## 2015-08-27 MED ORDER — HYDROCODONE-ACETAMINOPHEN 5-325 MG PO TABS: 1.0000 | ORAL_TABLET | ORAL | Status: DC | PRN

## 2015-08-27 MED ORDER — LACTATED RINGERS IV SOLN
INTRAVENOUS | Status: DC
  Administered 2015-08-27 (×2): via INTRAVENOUS

## 2015-08-27 MED ORDER — ONDANSETRON HCL 4 MG/2ML IJ SOLN: 4.0000 mg | Freq: Once | INTRAMUSCULAR | Status: DC | PRN

## 2015-08-27 MED ORDER — HYDROMORPHONE HCL 1 MG/ML IJ SOLN
INTRAMUSCULAR | Status: AC
  Administered 2015-08-27: 0.25 mg via INTRAVENOUS
  Filled 2015-08-27: qty 1

## 2015-08-27 MED ORDER — PROPOFOL 10 MG/ML IV BOLUS
INTRAVENOUS | Status: DC | PRN
  Administered 2015-08-27: 150 mg via INTRAVENOUS
  Administered 2015-08-27: 30 mg via INTRAVENOUS

## 2015-08-27 MED ORDER — FENTANYL CITRATE (PF) 100 MCG/2ML IJ SOLN
25.0000 ug | INTRAMUSCULAR | Status: DC | PRN
  Administered 2015-08-27 (×4): 25 ug via INTRAVENOUS

## 2015-08-27 MED ORDER — MIDAZOLAM HCL 2 MG/2ML IJ SOLN
INTRAMUSCULAR | Status: DC | PRN
  Administered 2015-08-27: 2 mg via INTRAVENOUS

## 2015-08-27 MED ORDER — KETOROLAC TROMETHAMINE 30 MG/ML IJ SOLN
INTRAMUSCULAR | Status: DC | PRN
  Administered 2015-08-27: 30 mg via INTRAVENOUS

## 2015-08-27 MED ORDER — ONDANSETRON HCL 4 MG/2ML IJ SOLN
INTRAMUSCULAR | Status: DC | PRN
  Administered 2015-08-27: 4 mg via INTRAVENOUS

## 2015-08-27 MED ORDER — POTASSIUM CHLORIDE CRYS ER 10 MEQ PO TBCR
10.0000 meq | EXTENDED_RELEASE_TABLET | Freq: Two times a day (BID) | ORAL | Status: DC
  Administered 2015-08-27 – 2015-08-28 (×2): 10 meq via ORAL
  Filled 2015-08-27 (×2): qty 1

## 2015-08-27 MED ORDER — HYDROMORPHONE HCL 1 MG/ML IJ SOLN
0.2500 mg | INTRAMUSCULAR | Status: DC | PRN
  Administered 2015-08-27 (×2): 0.25 mg via INTRAVENOUS

## 2015-08-27 MED ORDER — FENTANYL CITRATE (PF) 100 MCG/2ML IJ SOLN
INTRAMUSCULAR | Status: AC
  Administered 2015-08-27: 25 ug via INTRAVENOUS
  Filled 2015-08-27: qty 2

## 2015-08-27 MED ORDER — FAMOTIDINE 20 MG PO TABS: 20.0000 mg | ORAL_TABLET | Freq: Once | ORAL | Status: DC

## 2015-08-27 MED ORDER — TECHNETIUM TC 99M SULFUR COLLOID
1.0000 | Freq: Once | INTRAVENOUS | Status: AC | PRN
  Administered 2015-08-27: 0.92 via INTRAVENOUS

## 2015-08-27 MED ORDER — TRIAMTERENE-HCTZ 37.5-25 MG PO TABS
1.0000 | ORAL_TABLET | Freq: Every day | ORAL | Status: DC
  Administered 2015-08-28: 1 via ORAL
  Filled 2015-08-27: qty 1

## 2015-08-27 MED ORDER — METOPROLOL SUCCINATE ER 25 MG PO TB24
25.0000 mg | ORAL_TABLET | Freq: Every day | ORAL | Status: DC
  Administered 2015-08-28: 25 mg via ORAL
  Filled 2015-08-27: qty 1

## 2015-08-27 MED ORDER — KCL IN DEXTROSE-NACL 20-5-0.2 MEQ/L-%-% IV SOLN
INTRAVENOUS | Status: DC
  Administered 2015-08-27: 19:00:00 via INTRAVENOUS
  Filled 2015-08-27 (×3): qty 1000

## 2015-08-27 MED ORDER — FENTANYL CITRATE (PF) 100 MCG/2ML IJ SOLN
INTRAMUSCULAR | Status: DC | PRN
  Administered 2015-08-27 (×2): 50 ug via INTRAVENOUS

## 2015-08-27 MED ORDER — ACETAMINOPHEN 650 MG RE SUPP: 650.0000 mg | Freq: Four times a day (QID) | RECTAL | Status: DC | PRN

## 2015-08-27 MED ORDER — LIDOCAINE HCL (CARDIAC) 20 MG/ML IV SOLN
INTRAVENOUS | Status: DC | PRN
  Administered 2015-08-27: 100 mg via INTRAVENOUS

## 2015-08-27 MED ORDER — ACETAMINOPHEN 325 MG PO TABS
650.0000 mg | ORAL_TABLET | Freq: Four times a day (QID) | ORAL | Status: DC | PRN
Start: 2015-08-27 — End: 2015-08-28

## 2015-08-27 SURGICAL SUPPLY — 42 items
BULB RESERV EVAC DRAIN JP 100C (MISCELLANEOUS) ×6 IMPLANT
CANISTER SUCT 1200ML W/VALVE (MISCELLANEOUS) ×3 IMPLANT
CHLORAPREP W/TINT 26ML (MISCELLANEOUS) ×3 IMPLANT
CNTNR SPEC 2.5X3XGRAD LEK (MISCELLANEOUS) ×1
CONT SPEC 4OZ STER OR WHT (MISCELLANEOUS) ×2
CONTAINER SPEC 2.5X3XGRAD LEK (MISCELLANEOUS) ×1 IMPLANT
DRAIN CHANNEL JP 15F RND 16 (MISCELLANEOUS) IMPLANT
DRAIN CHANNEL JP 19F (MISCELLANEOUS) ×6 IMPLANT
DRAPE LAPAROTOMY TRNSV 106X77 (MISCELLANEOUS) ×3 IMPLANT
ELECT REM PT RETURN 9FT ADLT (ELECTROSURGICAL) ×3
ELECTRODE REM PT RTRN 9FT ADLT (ELECTROSURGICAL) ×1 IMPLANT
GAUZE SPONGE 4X4 12PLY STRL (GAUZE/BANDAGES/DRESSINGS) ×3 IMPLANT
GLOVE BIO SURGEON STRL SZ7.5 (GLOVE) ×12 IMPLANT
GOWN STRL REUS W/ TWL LRG LVL3 (GOWN DISPOSABLE) ×5 IMPLANT
GOWN STRL REUS W/TWL LRG LVL3 (GOWN DISPOSABLE) ×10
HANDLE YANKAUER SUCT BULB TIP (MISCELLANEOUS) ×3 IMPLANT
KIT RM TURNOVER STRD PROC AR (KITS) ×3 IMPLANT
LABEL OR SOLS (LABEL) ×3 IMPLANT
LIQUID BAND (GAUZE/BANDAGES/DRESSINGS) ×6 IMPLANT
PACK BASIN MAJOR ARMC (MISCELLANEOUS) ×3 IMPLANT
PACK BASIN MINOR ARMC (MISCELLANEOUS) ×3 IMPLANT
SLEVE PROBE SENORX GAMMA FIND (MISCELLANEOUS) ×3 IMPLANT
SPONGE DRAIN TRACH 4X4 STRL 2S (GAUZE/BANDAGES/DRESSINGS) ×6 IMPLANT
SPONGE LAP 18X18 5 PK (GAUZE/BANDAGES/DRESSINGS) ×3 IMPLANT
SUT CHROMIC 3 0 SH 27 (SUTURE) ×3 IMPLANT
SUT CHROMIC 4 0 RB 1X27 (SUTURE) IMPLANT
SUT ETHILON 3-0 FS-10 30 BLK (SUTURE) ×3
SUT ETHILON 4-0 (SUTURE) ×2
SUT ETHILON 4-0 FS2 18XMFL BLK (SUTURE) ×1
SUT MNCRL 4-0 (SUTURE) ×2
SUT MNCRL 4-0 27XMFL (SUTURE) ×1
SUT SILK 3 0 REEL (SUTURE) ×3 IMPLANT
SUT SILK 3-0 (SUTURE) ×4
SUT SILK 3-0 SH-1 18XCR BRD (SUTURE) ×2
SUT VICRYL+ 3-0 144IN (SUTURE) IMPLANT
SUTURE EHLN 3-0 FS-10 30 BLK (SUTURE) ×1 IMPLANT
SUTURE ETHLN 4-0 FS2 18XMF BLK (SUTURE) ×1 IMPLANT
SUTURE MNCRL 4-0 27XMF (SUTURE) ×1 IMPLANT
SUTURE SILK 3-0 SH-1 18XCR BRD (SUTURE) ×2 IMPLANT
TUBING CONNECTING 10 (TUBING) ×2 IMPLANT
TUBING CONNECTING 10' (TUBING) ×1
WATER STERILE IRR 1000ML POUR (IV SOLUTION) ×6 IMPLANT

## 2015-08-27 NOTE — Transfer of Care (Signed)
Immediate Anesthesia Transfer of Care Note  Patient: Beth Bender  Procedure(s) Performed: Procedure(s): SIMPLE MASTECTOMY WITH AXILLARY SENTINEL NODE BIOPSY (Right) MASTECTOMY MODIFIED RADICAL (Right)  Patient Location: PACU  Anesthesia Type:General  Level of Consciousness: sedated  Airway & Oxygen Therapy: Patient Spontanous Breathing and Patient connected to face mask oxygen  Post-op Assessment: Report given to RN and Post -op Vital signs reviewed and stable  Post vital signs: Reviewed and stable  Last Vitals:  Filed Vitals:   08/27/15 0842 08/27/15 1337  BP: 139/64 138/72  Pulse: 56 59  Temp: 36.4 C 36.8 C  Resp: 16 14    Complications: No apparent anesthesia complications

## 2015-08-27 NOTE — Anesthesia Postprocedure Evaluation (Signed)
Anesthesia Post Note  Patient: Beth Bender  Procedure(s) Performed: Procedure(s) (LRB): SIMPLE MASTECTOMY WITH AXILLARY SENTINEL NODE BIOPSY (Right) MASTECTOMY MODIFIED RADICAL (Right)  Patient location during evaluation: PACU Anesthesia Type: General Level of consciousness: awake and alert Pain management: pain level controlled Vital Signs Assessment: post-procedure vital signs reviewed and stable Respiratory status: spontaneous breathing, nonlabored ventilation, respiratory function stable and patient connected to nasal cannula oxygen Cardiovascular status: blood pressure returned to baseline and stable Postop Assessment: no signs of nausea or vomiting Anesthetic complications: no    Last Vitals:  Filed Vitals:   08/27/15 1347 08/27/15 1352  BP: 146/68   Pulse: 58 57  Temp:    Resp: 15 12    Last Pain:  Filed Vitals:   08/27/15 1355  PainSc: Philo Adams

## 2015-08-27 NOTE — Progress Notes (Signed)
She reports minimal discomfort after surgery.  I discussed the operative findings.  Her wound appears to be healing satisfactorily.  The drains appear to be functioning satisfactorily.  I discussed the plan for postoperative care.  We will start some solid food tonight.  Ambulate in the hallway.  Probable discharge tomorrow.

## 2015-08-27 NOTE — Anesthesia Procedure Notes (Signed)
Procedure Name: LMA Insertion Date/Time: 08/27/2015 7:54 AM Performed by: Doreen Salvage Pre-anesthesia Checklist: Patient identified, Patient being monitored, Timeout performed, Emergency Drugs available and Suction available Patient Re-evaluated:Patient Re-evaluated prior to inductionOxygen Delivery Method: Circle system utilized Preoxygenation: Pre-oxygenation with 100% oxygen Intubation Type: IV induction Ventilation: Mask ventilation without difficulty LMA: LMA inserted LMA Size: 4.5 Tube type: Oral Number of attempts: 1 Placement Confirmation: positive ETCO2 and breath sounds checked- equal and bilateral Tube secured with: Tape Dental Injury: Teeth and Oropharynx as per pre-operative assessment

## 2015-08-27 NOTE — H&P (Signed)
  She reports no change in overall condition since the day of the office examination.  Her preop serum potassium was low at 3.1 and she has been started on an oral supplements. Serum potassium 2 days ago was 3.5  She has had injection of radioactive technetium sulfur colloid.  The right side has been marked YES. I discussed the plan for right mastectomy.

## 2015-08-27 NOTE — Anesthesia Preprocedure Evaluation (Signed)
Anesthesia Evaluation  Patient identified by MRN, date of birth, ID band Patient awake    Reviewed: Allergy & Precautions, H&P , NPO status , Patient's Chart, lab work & pertinent test results, reviewed documented beta blocker date and time   Airway Mallampati: II  TM Distance: >3 FB Neck ROM: full    Dental no notable dental hx. (+) Teeth Intact   Pulmonary neg pulmonary ROS,    Pulmonary exam normal breath sounds clear to auscultation       Cardiovascular Exercise Tolerance: Good hypertension, negative cardio ROS   Rhythm:regular Rate:Normal     Neuro/Psych PSYCHIATRIC DISORDERS negative neurological ROS  negative psych ROS   GI/Hepatic negative GI ROS, Neg liver ROS, GERD  Medicated,  Endo/Other  negative endocrine ROSdiabetes  Renal/GU      Musculoskeletal   Abdominal   Peds  Hematology negative hematology ROS (+)   Anesthesia Other Findings   Reproductive/Obstetrics negative OB ROS                             Anesthesia Physical Anesthesia Plan  ASA: II  Anesthesia Plan: MAC   Post-op Pain Management:    Induction:   Airway Management Planned:   Additional Equipment:   Intra-op Plan:   Post-operative Plan:   Informed Consent: I have reviewed the patients History and Physical, chart, labs and discussed the procedure including the risks, benefits and alternatives for the proposed anesthesia with the patient or authorized representative who has indicated his/her understanding and acceptance.     Plan Discussed with: CRNA  Anesthesia Plan Comments:         Anesthesia Quick Evaluation

## 2015-08-27 NOTE — Op Note (Signed)
OPERATIVE REPORT  PREOPERATIVE  DIAGNOSIS: . Ductal carcinoma in situ of the right breast  POSTOPERATIVE DIAGNOSIS: . Ductal carcinoma in situ of right breast  PROCEDURE: . Right mastectomy with sentinel lymph node biopsy  ANESTHESIA:  General  SURGEON: Rochel Brome  MD   INDICATIONS: . She had recent x-ray findings of microcalcifications in the recovery right breast. Needle biopsy demonstrated ductal carcinoma in situ. Surgery was recommended for definitive treatment. The patient preferred to have a mastectomy. The patient did have preoperative injection of radioactive technetium sulfur colloid.  The patient was placed on the operating table in the supine position under general anesthesia. The right arm was placed on a lateral arm support. The axilla was probed with a gamma counter demonstrating location of radioactivity. The breast and surrounding chest wall were prepared with ChloraPrep and draped in a sterile manner.  An obliquely oriented curvilinear incision was made from inferior medial to superior lateral above and below the breast and carried down through subcutaneous tissues. Skin edges were retracted with 4-0 silk sutures. Skin and subcutaneous flaps were raised superiorly towards the clavicle medially to the sternum inferiorly to the inframammary fold and laterally to the latissimus dorsi muscle.  Further dissection was carried out up into the axilla and used the gamma counter to locate a lymph node with radioactivity. This lymph node was removed and had aproximally 600 counts per second. The node was approximately 1.2 cm in dimension and was submitted for frozen section. There is no other significant radioactivity found in the axilla there is no other palpable mass within the axilla.  The breast was elevated off the underlying deep fascia with electrocautery extending from medial to lateral.  The pathologist called to report no cancer was seen on touch preps.  The simple  mastectomy was completed with removal of the axillary tail. It is noted that during the course of procedure several bleeding points were suture ligated with 3-0 chromic. Hemostasis subsequently appeared to be intact. The wound was irrigated with warm saline solution. There was some redundant skin on the superior flap medially located which was excised. 2  19 French Blake drains were inserted through separate inferior stab wounds 1 placed into the axilla and the other along the medial chest wall. The drains were sutured to the skin with 3-0 nylon. The silk retraction sutures were removed. The wound was closed with a running 4-0 Monocryl subcuticular suture and LiquiBand. The drain site was dressed with folded cotton gauze benzoin and 3 inch silk tape.  The patient appeared to tolerate the procedure satisfactorily and was prepared for transfer to the recovery room  [facility].D.

## 2015-08-28 DIAGNOSIS — N6011 Diffuse cystic mastopathy of right breast: Secondary | ICD-10-CM | POA: Diagnosis not present

## 2015-08-28 MED ORDER — HYDROCODONE-ACETAMINOPHEN 5-325 MG PO TABS: 1.0000 | ORAL_TABLET | ORAL | Status: DC | PRN

## 2015-08-28 NOTE — Progress Notes (Signed)
Pt to be discharged per Md order. IV removed. Instructions reviewed with pt and family, all questions answered. JP drain education and instructions provided. SCript given to pt. Will be taken out in wheelchair.

## 2015-08-28 NOTE — Discharge Summary (Signed)
  She came in through the outpatient surgery department for right mastectomy with sentinel lymph node biopsy. She had a recent findings of ductal carcinoma in situ.  She was kept overnight for a period of observation after surgery. She reports good progress is morning. She has been walking. She is having minimal discomfort. Her wound appears to be healing satisfactorily. Her drains are functioning.  Discharge diagnosis ductal carcinoma in situ of the right breast, final pathology pending  I discussed the plans for Tylenol or Norco to be taken if needed. Patient will empty the drains 2 times per day and record drainage and call the office next week to set up a postoperative appointment.

## 2015-08-28 NOTE — Discharge Instructions (Addendum)
Empty and reactivate each drain and record amount of drainage 2 times per day.  Wait until after office visit to resume aspirin.  Take Tylenol or Norco if needed for pain.  Keep dressing dry  Call office on Tuesday to report the amount of drainage and schedule a postoperative appointment  Notify MD office for any worsening redness, swelling, bleeding or drainage from the incision site, fever of 100.4 or higher or pain that his not relieved with medications.

## 2015-08-30 ENCOUNTER — Encounter: Payer: Self-pay | Admitting: Oncology

## 2015-09-01 LAB — SURGICAL PATHOLOGY

## 2015-09-09 ENCOUNTER — Inpatient Hospital Stay: Payer: Medicare Other | Attending: Oncology | Admitting: Oncology

## 2015-09-09 VITALS — BP 153/80 | HR 66 | Temp 95.6°F | Resp 18 | Wt 201.1 lb

## 2015-09-09 DIAGNOSIS — Z803 Family history of malignant neoplasm of breast: Secondary | ICD-10-CM

## 2015-09-09 DIAGNOSIS — Z79810 Long term (current) use of selective estrogen receptor modulators (SERMs): Secondary | ICD-10-CM | POA: Insufficient documentation

## 2015-09-09 DIAGNOSIS — D0511 Intraductal carcinoma in situ of right breast: Secondary | ICD-10-CM | POA: Insufficient documentation

## 2015-09-09 DIAGNOSIS — G8918 Other acute postprocedural pain: Secondary | ICD-10-CM

## 2015-09-09 DIAGNOSIS — I10 Essential (primary) hypertension: Secondary | ICD-10-CM | POA: Insufficient documentation

## 2015-09-09 DIAGNOSIS — Z79899 Other long term (current) drug therapy: Secondary | ICD-10-CM | POA: Diagnosis not present

## 2015-09-09 DIAGNOSIS — Z9011 Acquired absence of right breast and nipple: Secondary | ICD-10-CM

## 2015-09-09 DIAGNOSIS — Z170 Estrogen receptor positive status [ER+]: Secondary | ICD-10-CM | POA: Diagnosis not present

## 2015-09-09 DIAGNOSIS — C50919 Malignant neoplasm of unspecified site of unspecified female breast: Secondary | ICD-10-CM

## 2015-09-09 DIAGNOSIS — I341 Nonrheumatic mitral (valve) prolapse: Secondary | ICD-10-CM | POA: Insufficient documentation

## 2015-09-09 DIAGNOSIS — Z800 Family history of malignant neoplasm of digestive organs: Secondary | ICD-10-CM | POA: Diagnosis not present

## 2015-09-09 DIAGNOSIS — K219 Gastro-esophageal reflux disease without esophagitis: Secondary | ICD-10-CM | POA: Insufficient documentation

## 2015-09-09 MED ORDER — TAMOXIFEN CITRATE 20 MG PO TABS: 20.0000 mg | ORAL_TABLET | Freq: Every day | ORAL | Status: DC

## 2015-09-09 NOTE — Progress Notes (Signed)
  Oncology Nurse Navigator Documentation  Navigator Location: [facility]-Med Onc (09/09/15 1500) Navigator Encounter Type: [place] Follow-up;Follow-up Appt;Treatment (09/09/15 1500)           Patient Visit Type: Follow-up;Surgery;MedOnc (09/09/15 1500) Treatment Phase: Treatment;Active Tx (09/09/15 1500) Barriers/Navigation Needs: Education (09/09/15 1500) Education: lymphedema class (09/09/15 1500)              Acuity: Level 2 (09/09/15 1500)   Acuity Level 2: Ongoing guidance and education throughout treatment as needed;Educational needs;Other (09/09/15 1500)     Time Spent with Patient: 75 (09/09/15 1500)  Met with patient , husband at follow-up post-mastectomy appointment.   Given info on Lymphedema class.  Educated on prosthesis suppliers, and offered assistance through [employer] if needed.  Patient has follow-up with Dr. Tamala Julian on 09/21/15.  Explained he will be the physician to allow driving, and after that appointment will be appropriate for bra/prosthesis fitting.

## 2015-09-09 NOTE — Progress Notes (Signed)
Offers no complaints  

## 2015-09-09 NOTE — Progress Notes (Signed)
[place]  [phone] Fax:([phone]  ID: Beth Bender OB: 06/15/1950  MR#: [id number]  [id number]  Patient Care Team: Dion Body, MD as PCP - General (Family Medicine)  CHIEF COMPLAINT: Right upper outer breast ER/PR positive DCIS status post mastectomy.  Chief Complaint  Patient presents with  . Breast Cancer    INTERVAL HISTORY: Patient returns to clinic today for further evaluation and discussion of her final pathology results. She has some mild tenderness at her surgical site, but otherwise feels well. She has no neurologic complaints. She denies any recent fevers or illnesses. She denies chest pain or shortness of breath. She has good appetite and denies weight loss. She has no nausea, vomiting, constipation, or diarrhea. She has no urinary complaints. Patient feels at her baseline and offers no specific complaints today.  REVIEW OF SYSTEMS:   Review of Systems  Constitutional: Negative for fever, weight loss and malaise/fatigue.  Respiratory: Negative.  Negative for cough and shortness of breath.   Cardiovascular: Negative.  Negative for chest pain.  Gastrointestinal: Negative.  Negative for abdominal pain.  Genitourinary: Negative.   Neurological: Negative.  Negative for weakness.  Psychiatric/Behavioral: Negative.     As per HPI. Otherwise, a complete review of systems is negatve.  PAST MEDICAL HISTORY: Past Medical History  Diagnosis Date  . Hypertension   . Mitral valve prolapse   . DCIS (ductal carcinoma in situ) of breast   . Post-menopausal   . Depression     history of, no meds at present  . Cancer ([place]) 08/05/2015    right breast  . Neuropathy ([place])     right hand  . GERD (gastroesophageal reflux disease)   . Anemia     PAST SURGICAL HISTORY: Past Surgical History  Procedure Laterality Date  . Abdominal hysterectomy    . Ganglion cyst excision Right     wrist  . Bunionectomy Left   . Simple  mastectomy with axillary sentinel node biopsy Right 08/27/2015    Procedure: SIMPLE MASTECTOMY WITH AXILLARY SENTINEL NODE BIOPSY;  Surgeon: Leonie Green, MD;  Location: [facility] ORS;  Service: General;  Laterality: Right;  . Mastectomy modified radical Right 08/27/2015    Procedure: MASTECTOMY MODIFIED RADICAL;  Surgeon: Leonie Green, MD;  Location: [facility] ORS;  Service: General;  Laterality: Right;    FAMILY HISTORY Family History  Problem Relation Age of Onset  . Breast cancer Maternal Aunt 90  . Colon cancer Maternal Uncle 62  . Cancer - Other Mother 70    sinus       ADVANCED DIRECTIVES:    HEALTH MAINTENANCE: Social History  Substance Use Topics  . Smoking status: Never Smoker   . Smokeless tobacco: Never Used  . Alcohol Use: No     Colonoscopy:  PAP:  Bone density:  Lipid panel:  Allergies  Allergen Reactions  . Hydrocodone Rash    Current Outpatient Prescriptions  Medication Sig Dispense Refill  . Cholecalciferol (VITAMIN D3) 2000 units capsule Take 2,000 Units by mouth daily.     . metoprolol succinate (TOPROL-XL) 25 MG 24 hr tablet Take 25 mg by mouth daily.     . potassium chloride (K-DUR) 10 MEQ tablet Take 1 tablet by mouth daily.    Marland Kitchen triamterene-hydrochlorothiazide (DYAZIDE) 37.5-25 MG capsule Take 1 capsule by mouth daily.     . tamoxifen (NOLVADEX) 20 MG tablet Take 1 tablet (20 mg total) by mouth daily. 30 tablet 3   No current facility-administered  medications for this visit.    OBJECTIVE: Filed Vitals:   09/09/15 1116  BP: 153/80  Pulse: 66  Temp: 95.6 F (35.3 C)  Resp: 18     Body mass index is 35.63 kg/(m^2).    ECOG FS:0 - Asymptomatic  General: Well-developed, well-nourished, no acute distress. Eyes: Pink conjunctiva, anicteric sclera. Breasts: Right breast mastectomy with well-healed surgical scar.  Lungs: Clear to auscultation bilaterally. Heart: Regular rate and rhythm. No rubs, murmurs, or gallops. Abdomen: Soft,  nontender, nondistended. No organomegaly noted, normoactive bowel sounds. Musculoskeletal: No edema, cyanosis, or clubbing. Neuro: Alert, answering all questions appropriately. Cranial nerves grossly intact. Skin: No rashes or petechiae noted. Psych: Normal affect.   LAB RESULTS:  Lab Results  Component Value Date   NA 142 08/27/2015   K 3.4* 08/27/2015   CL 103 08/18/2015   CO2 27 08/18/2015   GLUCOSE 115* 08/27/2015   BUN 19 08/18/2015   CREATININE 0.98 08/18/2015   CALCIUM 9.6 08/18/2015   GFRNONAA 59* 08/18/2015   GFRAA >60 08/18/2015    Lab Results  Component Value Date   WBC 5.4 08/18/2015   HGB 11.6* 08/27/2015   HCT 34.0* 08/27/2015   MCV 82.7 08/18/2015   PLT 172 08/18/2015     STUDIES: Nm Sentinel Node Inj-no Rpt (breast)  08/27/2015  CLINICAL DATA: DCIS (ductal carcinoma in situ), right Sulfur colloid was injected intradermally by the nuclear medicine technologist for breast cancer sentinel node localization.    ASSESSMENT: Right upper outer breast ER/PR positive DCIS status post mastectomy.  PLAN:    1. Right upper outer breast ER/PR positive DCIS status post mastectomy:  Pathology results reviewed independently without invasive component. Patient completed mastectomy with sentinel node biopsy and final pathology did not reveal an invasive component. Patient does not require adjuvant XRT. She was given a prescription for tamoxifen today which she will be required to take for total 5 years completing in July 2022. Return to clinic in 3 months for routine evaluation.   Approximately 30 minutes was spent in discussion of which greater than 50% was consultation.  Patient expressed understanding and was in agreement with this plan. She also understands that She can call clinic at any time with any questions, concerns, or complaints.   Ductal carcinoma in situ (DCIS) of right breast   Staging form: Breast, AJCC 7th Edition     Pathologic stage from 08/16/2015:  Stage 0 (Tis (DCIS), N0, M0) - Signed by Lloyd Huger, MD on 08/16/2015   Lloyd Huger, MD   09/09/2015 12:56 PM

## 2015-09-27 ENCOUNTER — Encounter: Payer: Self-pay | Admitting: Oncology

## 2015-09-29 LAB — SURGICAL PATHOLOGY

## 2015-10-01 ENCOUNTER — Other Ambulatory Visit: Payer: Self-pay | Admitting: *Deleted

## 2015-10-01 ENCOUNTER — Telehealth: Payer: Self-pay | Admitting: *Deleted

## 2015-10-01 DIAGNOSIS — D0511 Intraductal carcinoma in situ of right breast: Secondary | ICD-10-CM

## 2015-10-01 MED ORDER — TAMOXIFEN CITRATE 20 MG PO TABS: 20.0000 mg | ORAL_TABLET | Freq: Every day | ORAL | 3 refills | Status: DC

## 2015-10-01 NOTE — Telephone Encounter (Signed)
Prescription for tamoxifen sent to [facility].

## 2015-11-19 DIAGNOSIS — Z7185 Encounter for immunization safety counseling: Secondary | ICD-10-CM | POA: Insufficient documentation

## 2015-11-19 DIAGNOSIS — Z7189 Other specified counseling: Secondary | ICD-10-CM | POA: Insufficient documentation

## 2015-11-24 DIAGNOSIS — R7303 Prediabetes: Secondary | ICD-10-CM | POA: Insufficient documentation

## 2015-12-13 NOTE — Progress Notes (Signed)
[place]  [phone] Fax:([phone]) [phone]  ID: Rochele Pages OB: 1950/10/24  MR#: [id number]  [id number]  Patient Care Team: Dion Body, MD as PCP - General (Family Medicine)  CHIEF COMPLAINT: Right upper outer breast ER/PR positive DCIS status post mastectomy.  Chief Complaint  Patient presents with  . Breast Cancer    INTERVAL HISTORY: Patient returns to clinic today for routine 6 month evaluation. She is tolerating tamoxifen well without significant side effects. She has no neurologic complaints. She denies any recent fevers or illnesses. She denies chest pain or shortness of breath. She has a good appetite and denies weight loss. She has no nausea, vomiting, constipation, or diarrhea. She has no urinary complaints. Patient feels at her baseline and offers no specific complaints today.  REVIEW OF SYSTEMS:   Review of Systems  Constitutional: Negative for fever, malaise/fatigue and weight loss.  Respiratory: Negative.  Negative for cough and shortness of breath.   Cardiovascular: Negative.  Negative for chest pain.  Gastrointestinal: Negative.  Negative for abdominal pain.  Genitourinary: Negative.   Neurological: Negative.  Negative for weakness.  Psychiatric/Behavioral: Negative.     As per HPI. Otherwise, a complete review of systems is negative.  PAST MEDICAL HISTORY: Past Medical History:  Diagnosis Date  . Anemia   . Cancer ([place]) 08/05/2015   right breast  . DCIS (ductal carcinoma in situ) of breast   . Depression    history of, no meds at present  . GERD (gastroesophageal reflux disease)   . Hypertension   . Mitral valve prolapse   . Neuropathy ([place])    right hand  . Post-menopausal     PAST SURGICAL HISTORY: Past Surgical History:  Procedure Laterality Date  . ABDOMINAL HYSTERECTOMY    . BUNIONECTOMY Left   . GANGLION CYST EXCISION Right    wrist  . MASTECTOMY MODIFIED RADICAL Right 08/27/2015   Procedure:  MASTECTOMY MODIFIED RADICAL;  Surgeon: Leonie Green, MD;  Location: [facility] ORS;  Service: General;  Laterality: Right;  . SIMPLE MASTECTOMY WITH AXILLARY SENTINEL NODE BIOPSY Right 08/27/2015   Procedure: SIMPLE MASTECTOMY WITH AXILLARY SENTINEL NODE BIOPSY;  Surgeon: Leonie Green, MD;  Location: [facility] ORS;  Service: General;  Laterality: Right;    FAMILY HISTORY Family History  Problem Relation Age of Onset  . Breast cancer Maternal Aunt 90  . Colon cancer Maternal Uncle 72  . Cancer - Other Mother 59    sinus       ADVANCED DIRECTIVES:    HEALTH MAINTENANCE: Social History  Substance Use Topics  . Smoking status: Never Smoker  . Smokeless tobacco: Never Used  . Alcohol use No     Colonoscopy:  PAP:  Bone density:  Lipid panel:  Allergies  Allergen Reactions  . Hydrocodone Rash    Current Outpatient Prescriptions  Medication Sig Dispense Refill  . Cholecalciferol (VITAMIN D3) 2000 units capsule Take 2,000 Units by mouth daily.     . metoprolol succinate (TOPROL-XL) 25 MG 24 hr tablet Take 25 mg by mouth daily.     . potassium chloride (K-DUR) 10 MEQ tablet Take 1 tablet by mouth daily.    . tamoxifen (NOLVADEX) 20 MG tablet Take 1 tablet (20 mg total) by mouth daily. 90 tablet 3  . triamterene-hydrochlorothiazide (DYAZIDE) 37.5-25 MG capsule Take 1 capsule by mouth daily.      No current facility-administered medications for this visit.     OBJECTIVE: Vitals:   12/14/15 1029  BP: 137/66  Pulse: (!) 57  Resp: 18  Temp: (!) 96.5 F (35.8 C)     Body mass index is 35.91 kg/m.    ECOG FS:0 - Asymptomatic  General: Well-developed, well-nourished, no acute distress. Eyes: Pink conjunctiva, anicteric sclera. Breasts: Right breast mastectomy with well-healed surgical scar. Patient had recent exam by a different provider. Lungs: Clear to auscultation bilaterally. Heart: Regular rate and rhythm. No rubs, murmurs, or gallops. Abdomen: Soft, nontender,  nondistended. No organomegaly noted, normoactive bowel sounds. Musculoskeletal: No edema, cyanosis, or clubbing. Neuro: Alert, answering all questions appropriately. Cranial nerves grossly intact. Skin: No rashes or petechiae noted. Psych: Normal affect.   LAB RESULTS:  Lab Results  Component Value Date   NA 142 08/27/2015   K 3.4 (L) 08/27/2015   CL 103 08/18/2015   CO2 27 08/18/2015   GLUCOSE 115 (H) 08/27/2015   BUN 19 08/18/2015   CREATININE 0.98 08/18/2015   CALCIUM 9.6 08/18/2015   GFRNONAA 59 (L) 08/18/2015   GFRAA >60 08/18/2015    Lab Results  Component Value Date   WBC 5.4 08/18/2015   HGB 11.6 (L) 08/27/2015   HCT 34.0 (L) 08/27/2015   MCV 82.7 08/18/2015   PLT 172 08/18/2015     STUDIES: No results found.  ASSESSMENT: Right upper outer breast ER/PR positive DCIS status post mastectomy.  PLAN:    1. Right upper outer breast ER/PR positive DCIS status post mastectomy: Patient completed mastectomy with sentinel node biopsy and final pathology did not reveal an invasive component. Patient did not require adjuvant XRT. Continue tamoxifen which she will be required to take for total 5 years completing in July 2022. Return to clinic in 6 months for routine evaluation.    Patient expressed understanding and was in agreement with this plan. She also understands that She can call clinic at any time with any questions, concerns, or complaints.   Ductal carcinoma in situ (DCIS) of right breast   Staging form: Breast, AJCC 7th Edition     Pathologic stage from 08/16/2015: Stage 0 (Tis (DCIS), N0, M0) - Signed by Lloyd Huger, MD on 08/16/2015   Lloyd Huger, MD   12/16/2015 12:49 PM

## 2015-12-14 ENCOUNTER — Inpatient Hospital Stay: Payer: Medicare Other | Attending: Oncology | Admitting: Oncology

## 2015-12-14 VITALS — BP 137/66 | HR 57 | Temp 96.5°F | Resp 18 | Wt 202.7 lb

## 2015-12-14 DIAGNOSIS — Z79899 Other long term (current) drug therapy: Secondary | ICD-10-CM | POA: Diagnosis not present

## 2015-12-14 DIAGNOSIS — K219 Gastro-esophageal reflux disease without esophagitis: Secondary | ICD-10-CM | POA: Diagnosis not present

## 2015-12-14 DIAGNOSIS — D0511 Intraductal carcinoma in situ of right breast: Secondary | ICD-10-CM | POA: Diagnosis not present

## 2015-12-14 DIAGNOSIS — I10 Essential (primary) hypertension: Secondary | ICD-10-CM | POA: Diagnosis not present

## 2015-12-14 DIAGNOSIS — Z79810 Long term (current) use of selective estrogen receptor modulators (SERMs): Secondary | ICD-10-CM | POA: Insufficient documentation

## 2015-12-14 DIAGNOSIS — Z9011 Acquired absence of right breast and nipple: Secondary | ICD-10-CM | POA: Diagnosis not present

## 2015-12-14 DIAGNOSIS — Z800 Family history of malignant neoplasm of digestive organs: Secondary | ICD-10-CM | POA: Diagnosis not present

## 2015-12-14 DIAGNOSIS — Z170 Estrogen receptor positive status [ER+]: Secondary | ICD-10-CM | POA: Insufficient documentation

## 2015-12-14 DIAGNOSIS — Z803 Family history of malignant neoplasm of breast: Secondary | ICD-10-CM | POA: Diagnosis not present

## 2015-12-14 NOTE — Progress Notes (Signed)
Started on tamoxifen about 3 months ago. Complains of frequent hot flashes but is tolerating them well. Having tenderness to right mastectomy site and would like MD to evaluate.

## 2016-04-13 ENCOUNTER — Other Ambulatory Visit: Payer: Self-pay | Admitting: Family Medicine

## 2016-04-13 DIAGNOSIS — Z1231 Encounter for screening mammogram for malignant neoplasm of breast: Secondary | ICD-10-CM

## 2016-06-12 NOTE — Progress Notes (Signed)
[facility]  Telephone:([phone] Fax:([phone]  ID: Beth Bender OB: 28-Sep-1950  MR#: [id number]  NOM#:[id number]  Patient Care Team: Dion Body, MD as PCP - General (Family Medicine)  CHIEF COMPLAINT: Right upper outer quadrant ER/PR positive DCIS status post mastectomy.  INTERVAL HISTORY: Patient returns to clinic today for routine 6 month evaluation. She is tolerating tamoxifen well without significant side effects. She has no neurologic complaints. She denies any recent fevers or illnesses. She denies chest pain or shortness of breath. She has a good appetite and denies weight loss. She has no nausea, vomiting, constipation, or diarrhea. She has no urinary complaints. Patient feels at her baseline and offers no specific complaints today.  REVIEW OF SYSTEMS:   Review of Systems  Constitutional: Negative for fever, malaise/fatigue and weight loss.  Respiratory: Negative.  Negative for cough and shortness of breath.   Cardiovascular: Negative.  Negative for chest pain.  Gastrointestinal: Negative.  Negative for abdominal pain.  Genitourinary: Negative.   Neurological: Negative.  Negative for weakness.  Psychiatric/Behavioral: Negative.     As per HPI. Otherwise, a complete review of systems is negative.  PAST MEDICAL HISTORY: Past Medical History:  Diagnosis Date  . Anemia   . Cancer ([place]) 08/05/2015   right breast  . DCIS (ductal carcinoma in situ) of breast   . Depression    history of, no meds at present  . GERD (gastroesophageal reflux disease)   . Hypertension   . Mitral valve prolapse   . Neuropathy ([place])    right hand  . Post-menopausal     PAST SURGICAL HISTORY: Past Surgical History:  Procedure Laterality Date  . ABDOMINAL HYSTERECTOMY    . BUNIONECTOMY Left   . GANGLION CYST EXCISION Right    wrist  . MASTECTOMY MODIFIED RADICAL Right 08/27/2015   Procedure: MASTECTOMY MODIFIED RADICAL;  Surgeon: Leonie Green, MD;   Location: [facility] ORS;  Service: General;  Laterality: Right;  . SIMPLE MASTECTOMY WITH AXILLARY SENTINEL NODE BIOPSY Right 08/27/2015   Procedure: SIMPLE MASTECTOMY WITH AXILLARY SENTINEL NODE BIOPSY;  Surgeon: Leonie Green, MD;  Location: [facility] ORS;  Service: General;  Laterality: Right;    FAMILY HISTORY Family History  Problem Relation Age of Onset  . Breast cancer Maternal Aunt 90  . Colon cancer Maternal Uncle 16  . Cancer - Other Mother 34    sinus       ADVANCED DIRECTIVES:    HEALTH MAINTENANCE: Social History  Substance Use Topics  . Smoking status: Never Smoker  . Smokeless tobacco: Never Used  . Alcohol use No     Colonoscopy:  PAP:  Bone density:  Lipid panel:  Allergies  Allergen Reactions  . Hydrocodone Rash    Current Outpatient Prescriptions  Medication Sig Dispense Refill  . aspirin EC 81 MG tablet Take by mouth.    . Cholecalciferol (VITAMIN D3) 2000 units capsule Take 2,000 Units by mouth daily.     . metoprolol succinate (TOPROL-XL) 25 MG 24 hr tablet Take 25 mg by mouth daily.     . tamoxifen (NOLVADEX) 20 MG tablet Take 1 tablet (20 mg total) by mouth daily. 90 tablet 3  . triamterene-hydrochlorothiazide (DYAZIDE) 37.5-25 MG capsule Take 1 capsule by mouth daily.      No current facility-administered medications for this visit.     OBJECTIVE: Vitals:   06/13/16 1005  BP: 135/73  Pulse: 64     Body mass index is 36.9 kg/m.  ECOG FS:0 - Asymptomatic  General: Well-developed, well-nourished, no acute distress. Eyes: Pink conjunctiva, anicteric sclera. Breasts: Right breast mastectomy with well-healed surgical scar. Left breast without lumps or masses. Lungs: Clear to auscultation bilaterally. Heart: Regular rate and rhythm. No rubs, murmurs, or gallops. Abdomen: Soft, nontender, nondistended. No organomegaly noted, normoactive bowel sounds. Musculoskeletal: No edema, cyanosis, or clubbing. Neuro: Alert, answering all questions  appropriately. Cranial nerves grossly intact. Skin: No rashes or petechiae noted. Psych: Normal affect.   LAB RESULTS:  Lab Results  Component Value Date   NA 142 08/27/2015   K 3.4 (L) 08/27/2015   CL 103 08/18/2015   CO2 27 08/18/2015   GLUCOSE 115 (H) 08/27/2015   BUN 19 08/18/2015   CREATININE 0.98 08/18/2015   CALCIUM 9.6 08/18/2015   GFRNONAA 59 (L) 08/18/2015   GFRAA >60 08/18/2015    Lab Results  Component Value Date   WBC 5.4 08/18/2015   HGB 11.6 (L) 08/27/2015   HCT 34.0 (L) 08/27/2015   MCV 82.7 08/18/2015   PLT 172 08/18/2015     STUDIES: No results found.  ASSESSMENT: Right upper outer breast ER/PR positive DCIS status post mastectomy.  PLAN:    1. Right upper outer breast ER/PR positive DCIS status post mastectomy: Patient completed mastectomy with sentinel node biopsy and final pathology did not reveal an invasive component. Patient did not require adjuvant XRT. Continue tamoxifen which she will be required to take for total 5 years completing in July 2022.She will require diagnostic left mammogram in May 2018. Return to clinic in 6 months for routine evaluation.   Approximately 20 minutes spent in discussion of which greater than 50% was consultation.   Patient expressed understanding and was in agreement with this plan. She also understands that She can call clinic at any time with any questions, concerns, or complaints.   Ductal carcinoma in situ (DCIS) of right breast   Staging form: Breast, AJCC 7th Edition     Pathologic stage from 08/16/2015: Stage 0 (Tis (DCIS), N0, M0) - Signed by Lloyd Huger, MD on 08/16/2015   Lloyd Huger, MD   06/19/2016 10:20 AM

## 2016-06-13 ENCOUNTER — Inpatient Hospital Stay: Payer: Medicare Other | Attending: Oncology | Admitting: Oncology

## 2016-06-13 VITALS — BP 135/73 | HR 64 | Ht 63.0 in | Wt 208.3 lb

## 2016-06-13 DIAGNOSIS — K219 Gastro-esophageal reflux disease without esophagitis: Secondary | ICD-10-CM | POA: Diagnosis not present

## 2016-06-13 DIAGNOSIS — Z79899 Other long term (current) drug therapy: Secondary | ICD-10-CM | POA: Diagnosis not present

## 2016-06-13 DIAGNOSIS — I341 Nonrheumatic mitral (valve) prolapse: Secondary | ICD-10-CM | POA: Diagnosis not present

## 2016-06-13 DIAGNOSIS — Z800 Family history of malignant neoplasm of digestive organs: Secondary | ICD-10-CM | POA: Insufficient documentation

## 2016-06-13 DIAGNOSIS — Z9011 Acquired absence of right breast and nipple: Secondary | ICD-10-CM | POA: Diagnosis not present

## 2016-06-13 DIAGNOSIS — Z7982 Long term (current) use of aspirin: Secondary | ICD-10-CM | POA: Diagnosis not present

## 2016-06-13 DIAGNOSIS — Z803 Family history of malignant neoplasm of breast: Secondary | ICD-10-CM | POA: Diagnosis not present

## 2016-06-13 DIAGNOSIS — I10 Essential (primary) hypertension: Secondary | ICD-10-CM

## 2016-06-13 DIAGNOSIS — Z170 Estrogen receptor positive status [ER+]: Secondary | ICD-10-CM | POA: Diagnosis not present

## 2016-06-13 DIAGNOSIS — Z79810 Long term (current) use of selective estrogen receptor modulators (SERMs): Secondary | ICD-10-CM | POA: Diagnosis not present

## 2016-06-13 DIAGNOSIS — D0511 Intraductal carcinoma in situ of right breast: Secondary | ICD-10-CM | POA: Insufficient documentation

## 2016-06-13 MED ORDER — TAMOXIFEN CITRATE 20 MG PO TABS: 20.0000 mg | ORAL_TABLET | Freq: Every day | ORAL | 3 refills | Status: DC

## 2016-06-13 NOTE — Progress Notes (Signed)
Patient here for breast cancer follow up. States she has occasional "phantom itching" otherwise feels fine. Except for numbness in right arm.

## 2016-07-12 ENCOUNTER — Ambulatory Visit
Admission: RE | Admit: 2016-07-12 | Discharge: 2016-07-12 | Disposition: A | Discharge status: Home / Self Care | Payer: Medicare Other | Source: Ambulatory Visit | Attending: Family Medicine | Admitting: Family Medicine

## 2016-07-12 DIAGNOSIS — Z1231 Encounter for screening mammogram for malignant neoplasm of breast: Secondary | ICD-10-CM

## 2016-07-12 HISTORY — DX: Malignant neoplasm of unspecified site of unspecified female breast: C50.919

## 2016-11-28 DIAGNOSIS — Z66 Do not resuscitate: Secondary | ICD-10-CM | POA: Insufficient documentation

## 2016-12-11 NOTE — Progress Notes (Signed)
[facility] B[facility]  [phone] Fax:([phone]) [phone]  ID: LUDY MESSAMORE OB: March 25, 1950  MR#: [id number]  DZH#:[id number]  Patient Care Team: Dion Body, MD as PCP - General (Family Medicine)  CHIEF COMPLAINT: Right upper outer quadrant ER/PR positive DCIS status post mastectomy.  INTERVAL HISTORY: Patient returns to clinic today for routine 6 month evaluation. She continues to tolerate tamoxifen well without significant side effects. She has no neurologic complaints. She denies any recent fevers or illnesses. She denies chest pain or shortness of breath. She has a good appetite and denies weight loss. She has no nausea, vomiting, constipation, or diarrhea. She has no urinary complaints. Patient feels at her baseline and offers no specific complaints today.  REVIEW OF SYSTEMS:   Review of Systems  Constitutional: Negative for fever, malaise/fatigue and weight loss.  Respiratory: Negative.  Negative for cough and shortness of breath.   Cardiovascular: Negative.  Negative for chest pain and leg swelling.  Gastrointestinal: Negative.  Negative for abdominal pain.  Genitourinary: Negative.   Musculoskeletal: Negative.   Skin: Negative.  Negative for rash.  Neurological: Negative.  Negative for weakness.  Psychiatric/Behavioral: Negative.  The patient is not nervous/anxious.     As per HPI. Otherwise, a complete review of systems is negative.  PAST MEDICAL HISTORY: Past Medical History:  Diagnosis Date  . Anemia   . Breast cancer ([place]) 2017   right breast  . Cancer ([place]) 08/05/2015   right breast  . DCIS (ductal carcinoma in situ) of breast   . Depression    history of, no meds at present  . GERD (gastroesophageal reflux disease)   . Hypertension   . Mitral valve prolapse   . Neuropathy    right hand  . Post-menopausal     PAST SURGICAL HISTORY: Past Surgical History:  Procedure Laterality Date  . ABDOMINAL HYSTERECTOMY    . BUNIONECTOMY Left    . GANGLION CYST EXCISION Right    wrist  . MASTECTOMY Right 08/27/2015  . MASTECTOMY MODIFIED RADICAL Right 08/27/2015   Procedure: MASTECTOMY MODIFIED RADICAL;  Surgeon: Leonie Green, MD;  Location: [facility] OR[facility];  Service: General;  Laterality: Right;  . SIMPLE MASTECTOMY WITH AXILLARY SENTINEL NODE BIOPSY Right 08/27/2015   Procedure: SIMPLE MASTECTOMY WITH AXILLARY SENTINEL NODE BIOPSY;  Surgeon: Leonie Green, MD;  Location: [facility];  Service: General;  Laterality: Right;    FAMILY HISTORY Family History  Problem Relation Age of Onset  . Breast cancer Maternal Aunt 90  . Colon cancer Maternal Uncle 74  . Cancer - Other Mother 75       sinus       ADVANCED DIRECTIVES:    HEALTH MAINTENANCE: Social History  Substance Use Topics  . Smoking status: Never Smoker  . Smokeless tobacco: Never Used  . Alcohol use No     Colonoscopy:  PAP:  Bone density:  Lipid panel:  Allergies  Allergen Reactions  . Hydrocodone Rash    Current Outpatient Prescriptions  Medication Sig Dispense Refill  . aspirin EC 81 MG tablet Take by mouth.    . Cholecalciferol (VITAMIN D3) 2000 units capsule Take 2,000 Units by mouth daily.     . metoprolol succinate (TOPROL-XL) 25 MG 24 hr tablet Take 25 mg by mouth daily.     . tamoxifen (NOLVADEX) 20 MG tablet Take 1 tablet (20 mg total) by mouth daily. 90 tablet 3  . triamterene-hydrochlorothiazide (DYAZIDE) 37.5-25 MG capsule Take 1 capsule by mouth daily.  No current facility-administered medications for this visit.     OBJECTIVE: Vitals:   12/13/16 1358  BP: (!) 150/86  Pulse: 66  Resp: 18  Temp: 97.6 F (36.4 C)     Body mass index is 36.04 kg/m.    ECOG FS:0 - Asymptomatic  General: Well-developed, well-nourished, no acute distress. Eyes: Pink conjunctiva, anicteric sclera. Breasts: Right breast mastectomy with well-healed surgical scar. Left breast without lumps or masses. Lungs: Clear to auscultation  bilaterally. Heart: Regular rate and rhythm. No rubs, murmurs, or gallops. Abdomen: Soft, nontender, nondistended. No organomegaly noted, normoactive bowel sounds. Musculoskeletal: No edema, cyanosis, or clubbing. Neuro: Alert, answering all questions appropriately. Cranial nerves grossly intact. Skin: No rashes or petechiae noted. Psych: Normal affect.   LAB RESULTS:  Lab Results  Component Value Date   NA 142 08/27/2015   K 3.4 (L) 08/27/2015   CL 103 08/18/2015   CO2 27 08/18/2015   GLUCOSE 115 (H) 08/27/2015   BUN 19 08/18/2015   CREATININE 0.98 08/18/2015   CALCIUM 9.6 08/18/2015   GFRNONAA 59 (L) 08/18/2015   GFRAA >60 08/18/2015    Lab Results  Component Value Date   WBC 5.4 08/18/2015   HGB 11.6 (L) 08/27/2015   HCT 34.0 (L) 08/27/2015   MCV 82.7 08/18/2015   PLT 172 08/18/2015     STUDIES: No results found.  ASSESSMENT: Right upper outer breast ER/PR positive DCIS status post mastectomy.  PLAN:    1. Right upper outer breast ER/PR positive DCIS status post mastectomy: Patient completed mastectomy with sentinel node biopsy and final pathology did not reveal an invasive component. Patient did not require adjuvant XRT. Continue tamoxifen which she will take for total 5 years completing in July 2022. Her most recent left mammogram on Jul 12, 2016 was reported as BI-RADS 1. Repeat in May 2019. Return to clinic in 6 months for routine evaluation.   Approximately 20 minutes spent in discussion of which greater than 50% was consultation.   Patient expressed understanding and was in agreement with this plan. She also understands that She can call clinic at any time with any questions, concerns, or complaints.   Ductal carcinoma in situ (DCIS) of right breast   Staging form: Breast, AJCC 7th Edition     Pathologic stage from 08/16/2015: Stage 0 (Tis (DCIS), N0, M0) - Signed by Lloyd Huger, MD on 08/16/2015   Lloyd Huger, MD   12/16/2016 2:07  PM

## 2016-12-13 ENCOUNTER — Inpatient Hospital Stay: Payer: Medicare Other | Attending: Oncology | Admitting: Oncology

## 2016-12-13 VITALS — BP 150/86 | HR 66 | Temp 97.6°F | Resp 18 | Wt 203.4 lb

## 2016-12-13 DIAGNOSIS — Z90710 Acquired absence of both cervix and uterus: Secondary | ICD-10-CM | POA: Insufficient documentation

## 2016-12-13 DIAGNOSIS — Z803 Family history of malignant neoplasm of breast: Secondary | ICD-10-CM | POA: Diagnosis not present

## 2016-12-13 DIAGNOSIS — Z9011 Acquired absence of right breast and nipple: Secondary | ICD-10-CM | POA: Insufficient documentation

## 2016-12-13 DIAGNOSIS — Z808 Family history of malignant neoplasm of other organs or systems: Secondary | ICD-10-CM | POA: Insufficient documentation

## 2016-12-13 DIAGNOSIS — Z800 Family history of malignant neoplasm of digestive organs: Secondary | ICD-10-CM | POA: Diagnosis not present

## 2016-12-13 DIAGNOSIS — I10 Essential (primary) hypertension: Secondary | ICD-10-CM | POA: Insufficient documentation

## 2016-12-13 DIAGNOSIS — Z780 Asymptomatic menopausal state: Secondary | ICD-10-CM | POA: Insufficient documentation

## 2016-12-13 DIAGNOSIS — I341 Nonrheumatic mitral (valve) prolapse: Secondary | ICD-10-CM | POA: Diagnosis not present

## 2016-12-13 DIAGNOSIS — Z7982 Long term (current) use of aspirin: Secondary | ICD-10-CM | POA: Diagnosis not present

## 2016-12-13 DIAGNOSIS — D0511 Intraductal carcinoma in situ of right breast: Secondary | ICD-10-CM | POA: Diagnosis not present

## 2016-12-13 DIAGNOSIS — Z79899 Other long term (current) drug therapy: Secondary | ICD-10-CM | POA: Insufficient documentation

## 2016-12-13 DIAGNOSIS — K219 Gastro-esophageal reflux disease without esophagitis: Secondary | ICD-10-CM | POA: Insufficient documentation

## 2016-12-13 DIAGNOSIS — Z79810 Long term (current) use of selective estrogen receptor modulators (SERMs): Secondary | ICD-10-CM | POA: Diagnosis not present

## 2016-12-13 DIAGNOSIS — Z170 Estrogen receptor positive status [ER+]: Secondary | ICD-10-CM | POA: Diagnosis not present

## 2016-12-13 NOTE — Progress Notes (Signed)
Pt in for a 6 month follow up.   Denies any difficulties at this time.

## 2017-05-22 ENCOUNTER — Other Ambulatory Visit: Payer: Self-pay | Admitting: *Deleted

## 2017-05-22 DIAGNOSIS — E7800 Pure hypercholesterolemia, unspecified: Secondary | ICD-10-CM | POA: Diagnosis not present

## 2017-05-22 DIAGNOSIS — D0511 Intraductal carcinoma in situ of right breast: Secondary | ICD-10-CM

## 2017-05-22 DIAGNOSIS — R7303 Prediabetes: Secondary | ICD-10-CM | POA: Diagnosis not present

## 2017-05-22 MED ORDER — TAMOXIFEN CITRATE 20 MG PO TABS: 20.0000 mg | ORAL_TABLET | Freq: Every day | ORAL | 0 refills | Status: DC

## 2017-05-29 DIAGNOSIS — I10 Essential (primary) hypertension: Secondary | ICD-10-CM | POA: Diagnosis not present

## 2017-05-29 DIAGNOSIS — E7800 Pure hypercholesterolemia, unspecified: Secondary | ICD-10-CM | POA: Diagnosis not present

## 2017-05-29 DIAGNOSIS — R7303 Prediabetes: Secondary | ICD-10-CM | POA: Diagnosis not present

## 2017-05-29 DIAGNOSIS — E6609 Other obesity due to excess calories: Secondary | ICD-10-CM | POA: Diagnosis not present

## 2017-05-29 DIAGNOSIS — Z6837 Body mass index (BMI) 37.0-37.9, adult: Secondary | ICD-10-CM | POA: Diagnosis not present

## 2017-06-09 ENCOUNTER — Other Ambulatory Visit: Payer: Self-pay | Admitting: Family Medicine

## 2017-06-09 DIAGNOSIS — Z1231 Encounter for screening mammogram for malignant neoplasm of breast: Secondary | ICD-10-CM

## 2017-06-13 ENCOUNTER — Ambulatory Visit: Payer: Medicare Other | Admitting: Oncology

## 2017-06-18 NOTE — Progress Notes (Signed)
[facility]  Telephone:(336) [phone] Fax:(336) [phone]  ID: KENIA TEAGARDEN OB: Nov 24, 1950  MR#: [id number]  IWP#:[id number]  Patient Care Team: Dion Body, MD as PCP - General (Family Medicine)  CHIEF COMPLAINT: Right upper outer quadrant ER/PR positive DCIS status post mastectomy.  INTERVAL HISTORY: Patient returns to clinic today for routine 80-month evaluation.  She continues to tolerate tamoxifen without significant side effects.  She currently feels well and is asymptomatic.  She has no neurologic complaints. She denies any recent fevers or illnesses. She denies chest pain or shortness of breath. She has a good appetite and denies weight loss. She has no nausea, vomiting, constipation, or diarrhea. She has no urinary complaints.  Patient offers no specific complaints today.  REVIEW OF SYSTEMS:   Review of Systems  Constitutional: Negative for fever, malaise/fatigue and weight loss.  Respiratory: Negative.  Negative for cough and shortness of breath.   Cardiovascular: Negative.  Negative for chest pain and leg swelling.  Gastrointestinal: Negative.  Negative for abdominal pain and constipation.  Genitourinary: Negative.   Musculoskeletal: Negative.   Skin: Negative.  Negative for rash.  Neurological: Negative.  Negative for sensory change, focal weakness and weakness.  Psychiatric/Behavioral: Negative.  The patient is not nervous/anxious.     As per HPI. Otherwise, a complete review of systems is negative.  PAST MEDICAL HISTORY: Past Medical History:  Diagnosis Date  . Anemia   . Breast cancer ([place]) 2017   right breast  . Cancer ([place]) 08/05/2015   right breast  . DCIS (ductal carcinoma in situ) of breast   . Depression    history of, no meds at present  . GERD (gastroesophageal reflux disease)   . Hypertension   . Mitral valve prolapse   . Neuropathy    right hand  . Post-menopausal     PAST SURGICAL HISTORY: Past Surgical History:    Procedure Laterality Date  . ABDOMINAL HYSTERECTOMY    . BUNIONECTOMY Left   . GANGLION CYST EXCISION Right    wrist  . MASTECTOMY Right 08/27/2015  . MASTECTOMY MODIFIED RADICAL Right 08/27/2015   Procedure: MASTECTOMY MODIFIED RADICAL;  Surgeon: Leonie Green, MD;  Location: [facility];  Service: General;  Laterality: Right;  . SIMPLE MASTECTOMY WITH AXILLARY SENTINEL NODE BIOPSY Right 08/27/2015   Procedure: SIMPLE MASTECTOMY WITH AXILLARY SENTINEL NODE BIOPSY;  Surgeon: Leonie Green, MD;  Location: [facility];  Service: General;  Laterality: Right;    FAMILY HISTORY Family History  Problem Relation Age of Onset  . Breast cancer Maternal Aunt 90  . Colon cancer Maternal Uncle 73  . Cancer - Other Mother 41       sinus       ADVANCED DIRECTIVES:    HEALTH MAINTENANCE: Social History   Tobacco Use  . Smoking status: Never Smoker  . Smokeless tobacco: Never Used  Substance Use Topics  . Alcohol use: No    Alcohol/week: 0.0 oz  . Drug use: No     Colonoscopy:  PAP:  Bone density:  Lipid panel:  Allergies  Allergen Reactions  . Hydrocodone Rash    Current Outpatient Medications  Medication Sig Dispense Refill  . aspirin EC 81 MG tablet Take by mouth.    . Cholecalciferol (VITAMIN D3) 2000 units capsule Take 2,000 Units by mouth daily.     . metoprolol succinate (TOPROL-XL) 25 MG 24 hr tablet Take 25 mg by mouth daily.     . tamoxifen (NOLVADEX) 20 MG tablet  Take 1 tablet (20 mg total) by mouth daily. 90 tablet 0  . triamterene-hydrochlorothiazide (DYAZIDE) 37.5-25 MG capsule Take 1 capsule by mouth daily.      No current facility-administered medications for this visit.     OBJECTIVE: Vitals:   06/20/17 1117 06/20/17 1120  BP:  (!) 148/87  Pulse:  62  Resp: 12   Temp:  (!) 97.2 F (36.2 C)     Body mass index is 37.15 kg/m.    ECOG FS:0 - Asymptomatic  General: Well-developed, well-nourished, no acute distress. Eyes: Pink conjunctiva,  anicteric sclera. Breast: Right mastectomy. Lungs: Clear to auscultation bilaterally. Heart: Regular rate and rhythm. No rubs, murmurs, or gallops. Abdomen: Soft, nontender, nondistended. No organomegaly noted, normoactive bowel sounds. Musculoskeletal: No edema, cyanosis, or clubbing. Neuro: Alert, answering all questions appropriately. Cranial nerves grossly intact. Skin: No rashes or petechiae noted. Psych: Normal affect.  LAB RESULTS:  Lab Results  Component Value Date   NA 142 08/27/2015   K 3.4 (L) 08/27/2015   CL 103 08/18/2015   CO2 27 08/18/2015   GLUCOSE 115 (H) 08/27/2015   BUN 19 08/18/2015   CREATININE 0.98 08/18/2015   CALCIUM 9.6 08/18/2015   GFRNONAA 59 (L) 08/18/2015   GFRAA >60 08/18/2015    Lab Results  Component Value Date   WBC 5.4 08/18/2015   HGB 11.6 (L) 08/27/2015   HCT 34.0 (L) 08/27/2015   MCV 82.7 08/18/2015   PLT 172 08/18/2015     STUDIES: No results found.  ASSESSMENT: Right upper outer breast ER/PR positive DCIS status post mastectomy.  PLAN:    1. Right upper outer breast ER/PR positive DCIS status post mastectomy: Patient had her mastectomy on Aug 04, 2015 in which final pathology did not reveal an invasive component.  She then underwent sentinel node excision on August 27, 2015 that was negative for disease.  Because she had a mastectomy, she did not require adjuvant XRT.  Continue tamoxifen which she will take for total 5 years completing in July 2022. Her most recent left mammogram on Jul 12, 2016 was reported as BI-RADS 1. Repeat in May 2019. Return to clinic in 6 months for routine evaluation.   Approximately 20 minutes spent in discussion of which greater than 50% was consultation.   Patient expressed understanding and was in agreement with this plan. She also understands that She can call clinic at any time with any questions, concerns, or complaints.   Ductal carcinoma in situ (DCIS) of right breast   Staging form: Breast, AJCC  7th Edition     Pathologic stage from 08/16/2015: Stage 0 (Tis (DCIS), N0, M0) - Signed by Lloyd Huger, MD on 08/16/2015   Lloyd Huger, MD   06/20/2017 11:57 AM

## 2017-06-20 ENCOUNTER — Encounter: Payer: Self-pay | Admitting: Oncology

## 2017-06-20 ENCOUNTER — Inpatient Hospital Stay: Payer: Medicare HMO | Attending: Oncology | Admitting: Oncology

## 2017-06-20 ENCOUNTER — Other Ambulatory Visit: Payer: Self-pay

## 2017-06-20 VITALS — BP 148/87 | HR 62 | Temp 97.2°F | Resp 12 | Ht 62.0 in | Wt 203.1 lb

## 2017-06-20 DIAGNOSIS — I10 Essential (primary) hypertension: Secondary | ICD-10-CM | POA: Insufficient documentation

## 2017-06-20 DIAGNOSIS — D0511 Intraductal carcinoma in situ of right breast: Secondary | ICD-10-CM | POA: Diagnosis not present

## 2017-06-20 DIAGNOSIS — Z780 Asymptomatic menopausal state: Secondary | ICD-10-CM | POA: Diagnosis not present

## 2017-06-20 DIAGNOSIS — Z9011 Acquired absence of right breast and nipple: Secondary | ICD-10-CM | POA: Insufficient documentation

## 2017-06-20 DIAGNOSIS — Z170 Estrogen receptor positive status [ER+]: Secondary | ICD-10-CM | POA: Insufficient documentation

## 2017-06-20 DIAGNOSIS — Z79810 Long term (current) use of selective estrogen receptor modulators (SERMs): Secondary | ICD-10-CM | POA: Diagnosis not present

## 2017-06-20 NOTE — Progress Notes (Signed)
Patient here for follow up. No changes since last appt. 

## 2017-07-13 ENCOUNTER — Ambulatory Visit
Admission: RE | Admit: 2017-07-13 | Discharge: 2017-07-13 | Disposition: A | Discharge status: Home / Self Care | Payer: Medicare HMO | Source: Ambulatory Visit | Attending: Family Medicine | Admitting: Family Medicine

## 2017-07-13 DIAGNOSIS — Z1231 Encounter for screening mammogram for malignant neoplasm of breast: Secondary | ICD-10-CM | POA: Insufficient documentation

## 2017-07-16 ENCOUNTER — Encounter: Payer: Self-pay | Admitting: Emergency Medicine

## 2017-07-16 ENCOUNTER — Emergency Department
Admission: EM | Admit: 2017-07-16 | Discharge: 2017-07-16 | Disposition: A | Discharge status: Home / Self Care | Payer: Medicare HMO | Attending: Emergency Medicine | Admitting: Emergency Medicine

## 2017-07-16 ENCOUNTER — Other Ambulatory Visit: Payer: Self-pay

## 2017-07-16 ENCOUNTER — Emergency Department: Payer: Medicare HMO

## 2017-07-16 DIAGNOSIS — I10 Essential (primary) hypertension: Secondary | ICD-10-CM | POA: Diagnosis not present

## 2017-07-16 DIAGNOSIS — Y9301 Activity, walking, marching and hiking: Secondary | ICD-10-CM | POA: Diagnosis not present

## 2017-07-16 DIAGNOSIS — S60221A Contusion of right hand, initial encounter: Secondary | ICD-10-CM

## 2017-07-16 DIAGNOSIS — Z7982 Long term (current) use of aspirin: Secondary | ICD-10-CM | POA: Insufficient documentation

## 2017-07-16 DIAGNOSIS — W010XXA Fall on same level from slipping, tripping and stumbling without subsequent striking against object, initial encounter: Secondary | ICD-10-CM | POA: Diagnosis not present

## 2017-07-16 DIAGNOSIS — Z79899 Other long term (current) drug therapy: Secondary | ICD-10-CM | POA: Insufficient documentation

## 2017-07-16 DIAGNOSIS — M79641 Pain in right hand: Secondary | ICD-10-CM | POA: Diagnosis not present

## 2017-07-16 DIAGNOSIS — Y929 Unspecified place or not applicable: Secondary | ICD-10-CM | POA: Diagnosis not present

## 2017-07-16 DIAGNOSIS — S60211A Contusion of right wrist, initial encounter: Secondary | ICD-10-CM | POA: Insufficient documentation

## 2017-07-16 DIAGNOSIS — Y999 Unspecified external cause status: Secondary | ICD-10-CM | POA: Insufficient documentation

## 2017-07-16 DIAGNOSIS — S6991XA Unspecified injury of right wrist, hand and finger(s), initial encounter: Secondary | ICD-10-CM | POA: Diagnosis present

## 2017-07-16 MED ORDER — MELOXICAM 15 MG PO TABS: 15.0000 mg | ORAL_TABLET | Freq: Every day | ORAL | 1 refills | Status: AC

## 2017-07-16 NOTE — ED Triage Notes (Signed)
Pt reports she tripped and fell forward this morning landing on her right hand. Pain, swelling and bruising noted to right hand. CMS intact.

## 2017-07-17 NOTE — ED Provider Notes (Signed)
[facility] Emergency Department Provider Note  ____________________________________________  Time seen: Approximately 12:12 AM  I have reviewed the triage vital signs and the nursing notes.   HISTORY  Chief Complaint Fall and Hand Injury    HPI Beth Bender is a 67 y.o. female presents to the emergency department with 7 out of 10 aching right wrist pain after patient fell on an outstretched right hand earlier this morning.  Patient has noticed soft tissue swelling along the dorsum of the right hand.  She has not experienced right hand avoidance.  She denies weakness, radiculopathy or changes in sensation of the upper extremities.  Patient did not hit her head or lose consciousness during fall.   Past Medical History:  Diagnosis Date  . Anemia   . Breast cancer ([place]) 2017   right breast DCIS Grade II  . Cancer ([place]) 08/05/2015   right breast  . DCIS (ductal carcinoma in situ) of breast   . Depression    history of, no meds at present  . GERD (gastroesophageal reflux disease)   . Hypertension   . Mitral valve prolapse   . Neuropathy    right hand  . Post-menopausal     Patient Active Problem List   Diagnosis Date Noted  . Ductal carcinoma in situ (DCIS) of right breast 08/16/2015    Past Surgical History:  Procedure Laterality Date  . ABDOMINAL HYSTERECTOMY    . BREAST BIOPSY Right 08/10/2015   DCIS GRADE II  . BUNIONECTOMY Left   . GANGLION CYST EXCISION Right    wrist  . MASTECTOMY Right 08/27/2015   right breast DCIS Grade II  . MASTECTOMY MODIFIED RADICAL Right 08/27/2015   Procedure: MASTECTOMY MODIFIED RADICAL;  Surgeon: Leonie Green, MD;  Location: [facility] ORS;  Service: General;  Laterality: Right;  . SIMPLE MASTECTOMY WITH AXILLARY SENTINEL NODE BIOPSY Right 08/27/2015   Procedure: SIMPLE MASTECTOMY WITH AXILLARY SENTINEL NODE BIOPSY;  Surgeon: Leonie Green, MD;  Location: [facility] ORS;  Service: General;  Laterality: Right;     Prior to Admission medications   Medication Sig Start Date End Date Taking? Authorizing Provider  aspirin EC 81 MG tablet Take by mouth.    [provider]  Cholecalciferol (VITAMIN D3) 2000 units capsule Take 2,000 Units by mouth daily.     [provider]  meloxicam (MOBIC) 15 MG tablet Take 1 tablet (15 mg total) by mouth daily for 7 days. 07/16/17 07/23/17  Lannie Fields, PA-C  metoprolol succinate (TOPROL-XL) 25 MG 24 hr tablet Take 25 mg by mouth daily.  02/04/15   [provider]  tamoxifen (NOLVADEX) 20 MG tablet Take 1 tablet (20 mg total) by mouth daily. 05/22/17   Lloyd Huger, MD  triamterene-hydrochlorothiazide (DYAZIDE) 37.5-25 MG capsule Take 1 capsule by mouth daily.  05/15/15   [provider]    Allergies Hydrocodone  Family History  Problem Relation Age of Onset  . Breast cancer Maternal Aunt 90  . Colon cancer Maternal Uncle 66  . Cancer - Other Mother 83       sinus    Social History Social History   Tobacco Use  . Smoking status: Never Smoker  . Smokeless tobacco: Never Used  Substance Use Topics  . Alcohol use: No    Alcohol/week: 0.0 oz  . Drug use: No     Review of Systems  Constitutional: No fever/chills Eyes: No visual changes. No discharge ENT: No upper respiratory complaints. Cardiovascular:  no chest pain. Respiratory: no cough. No SOB. Gastrointestinal: No abdominal pain.  No nausea, no vomiting.  No diarrhea.  No constipation. Genitourinary: Negative for dysuria. No hematuria Musculoskeletal: Patient has right hand pain. Skin: Negative for rash, abrasions, lacerations, ecchymosis. Neurological: Negative for headaches, focal weakness or numbness.   ____________________________________________   PHYSICAL EXAM:  VITAL SIGNS: ED Triage Vitals [07/16/17 2058]  Enc Vitals Group     BP (!) 150/70     Pulse Rate (!) 59     Resp 18     Temp 98.4 F (36.9 C)     Temp Source Oral     SpO2  96 %     Weight 200 lb (90.7 kg)     Height 5\' 3"  (1.6 m)     Head Circumference      Peak Flow      Pain Score 5     Pain Loc      Pain Edu?      Excl. in [place]?      Constitutional: Alert and oriented. Well appearing and in no acute distress. Eyes: Conjunctivae are normal. PERRL. EOMI. Head: Atraumatic. Cardiovascular: Normal rate, regular rhythm. Normal S1 and S2.  Good peripheral circulation. Respiratory: Normal respiratory effort without tachypnea or retractions. Lungs CTAB. Good air entry to the bases with no decreased or absent breath sounds. Musculoskeletal:   Patient is able to perform full range of motion at the right wrist.  She is able to move all 5 right fingers.  Palpable radial pulse, right.  Soft tissue swelling along the dorsum of the right hand visualized.  Neurologic:  Normal speech and language. No gross focal neurologic deficits are appreciated.  Skin:  Skin is warm, dry and intact. No rash noted. Psychiatric: Mood and affect are normal. Speech and behavior are normal. Patient exhibits appropriate insight and judgement.   ____________________________________________   LABS (all labs ordered are listed, but only abnormal results are displayed)  Labs Reviewed - No data to display ____________________________________________  EKG   ____________________________________________  RADIOLOGY Unk Pinto, personally viewed and evaluated these images (plain radiographs) as part of my medical decision making, as well as reviewing the written report by the radiologist.  Dg Hand Complete Right  Result Date: 07/16/2017 CLINICAL DATA:  Acute RIGHT hand pain following fall today. Initial encounter. EXAM: RIGHT HAND - COMPLETE 3+ VIEW COMPARISON:  None. FINDINGS: No acute fracture, subluxation or dislocation identified. Soft tissue swelling is present. Degenerative changes at the DIP joints and LATERAL wrist noted. IMPRESSION: Soft tissue swelling without acute bony  abnormality. Electronically Signed   By: Margarette Canada M.D.   On: 07/16/2017 22:17    ____________________________________________    PROCEDURES  Procedure(s) performed:    Procedures    Medications - No data to display   ____________________________________________   INITIAL IMPRESSION / ASSESSMENT AND PLAN / ED COURSE  Pertinent labs & imaging results that were available during my care of the patient were reviewed by me and considered in my medical decision making (see chart for details).  Review of the [place] CSRS was performed in accordance of the Central prior to dispensing any controlled drugs.    Assessment and plan Right hand pain Patient presents to the emergency department with right hand pain after a fall.  Differential diagnosis included fracture versus contusion.  X-ray examination revealed no acute fractures or bony abnormalities.  Patient was placed in a Velcro wrist splint in the emergency department and referred  to orthopedics.  She was discharged with meloxicam after she assured me that she does not have a history of GI bleeds and tolerates anti-inflammatories without complication.  ____________________________________________  FINAL CLINICAL IMPRESSION(S) / ED DIAGNOSES  Final diagnoses:  Contusion of right hand, initial encounter      NEW MEDICATIONS STARTED DURING THIS VISIT:  ED Discharge Orders        Ordered    meloxicam (MOBIC) 15 MG tablet  Daily     07/16/17 2305          This chart was dictated using voice recognition software/Dragon. Despite best efforts to proofread, errors can occur which can change the meaning. Any change was purely unintentional.    Lannie Fields, PA-C 07/17/17 0014    Orbie Pyo, MD 07/17/17 2226

## 2017-07-20 DIAGNOSIS — M25531 Pain in right wrist: Secondary | ICD-10-CM | POA: Diagnosis not present

## 2017-07-24 ENCOUNTER — Other Ambulatory Visit: Payer: Self-pay | Admitting: Oncology

## 2017-07-24 DIAGNOSIS — D0511 Intraductal carcinoma in situ of right breast: Secondary | ICD-10-CM

## 2017-09-01 DIAGNOSIS — Z853 Personal history of malignant neoplasm of breast: Secondary | ICD-10-CM | POA: Diagnosis not present

## 2017-09-21 DIAGNOSIS — I10 Essential (primary) hypertension: Secondary | ICD-10-CM | POA: Diagnosis not present

## 2017-09-21 DIAGNOSIS — E7800 Pure hypercholesterolemia, unspecified: Secondary | ICD-10-CM | POA: Diagnosis not present

## 2017-09-21 DIAGNOSIS — R7303 Prediabetes: Secondary | ICD-10-CM | POA: Diagnosis not present

## 2017-09-27 DIAGNOSIS — Z6836 Body mass index (BMI) 36.0-36.9, adult: Secondary | ICD-10-CM | POA: Diagnosis not present

## 2017-09-27 DIAGNOSIS — F3340 Major depressive disorder, recurrent, in remission, unspecified: Secondary | ICD-10-CM | POA: Diagnosis not present

## 2017-09-27 DIAGNOSIS — E7800 Pure hypercholesterolemia, unspecified: Secondary | ICD-10-CM | POA: Diagnosis not present

## 2017-09-27 DIAGNOSIS — E6609 Other obesity due to excess calories: Secondary | ICD-10-CM | POA: Diagnosis not present

## 2017-09-27 DIAGNOSIS — R7303 Prediabetes: Secondary | ICD-10-CM | POA: Diagnosis not present

## 2017-09-27 DIAGNOSIS — I10 Essential (primary) hypertension: Secondary | ICD-10-CM | POA: Diagnosis not present

## 2017-11-17 IMAGING — MR MR CERVICAL SPINE W/O CM
5 series · 34 of 48 positions shown · non-contrast
Comparison: Report from 06/18/2015

CLINICAL DATA: Neck pain and numbness with tingling in the right
hand and pain extending to the right shoulder. Symptoms for 1 year.

EXAM:
MRI CERVICAL SPINE WITHOUT CONTRAST
TECHNIQUE: Multiplanar, multisequence MR imaging of the cervical spine was
performed. No intravenous contrast was administered.

[Series 5: T1 · sagittal · 3.0mm · 0.70mm/px · 8 of 15 slices shown]
[im 1/15]
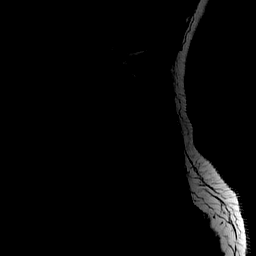
[im 3/15]
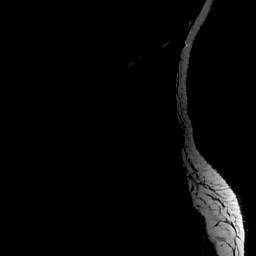
[im 5/15]
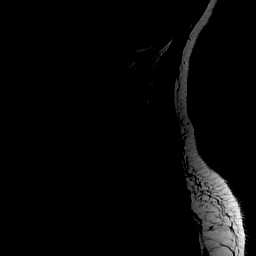
[im 7/15]
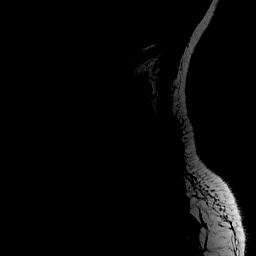
[im 9/15]
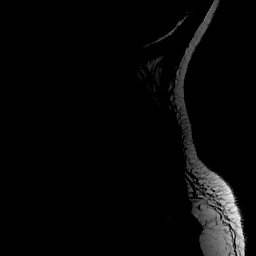
[im 11/15]
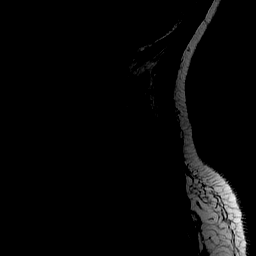
[im 13/15]
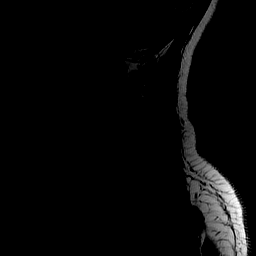
[im 15/15]
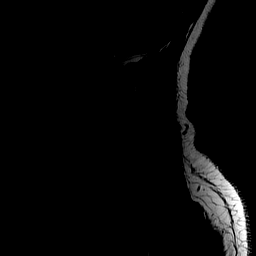

[Series 6: T2 · sagittal · 3.0mm · 0.56mm/px · 7 of 15 slices shown (1 of 2)]
[im 1/15]
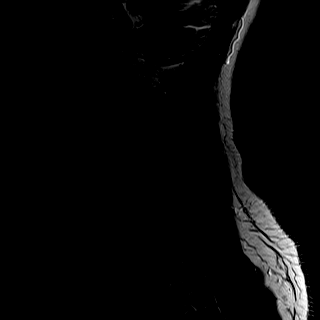
[im 3/15]
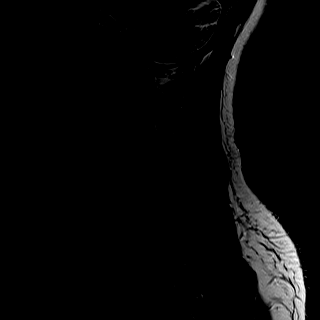
[im 5/15]
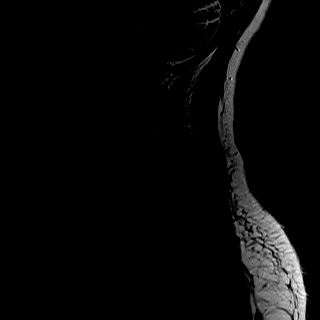
[im 8/15]
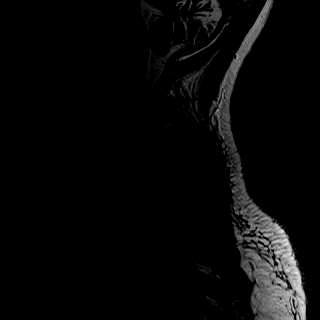
[im 10/15]
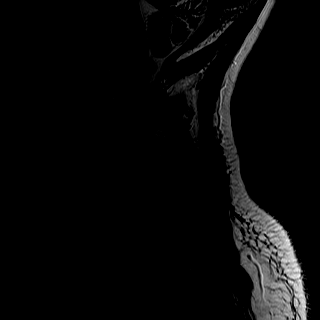
[im 12/15]
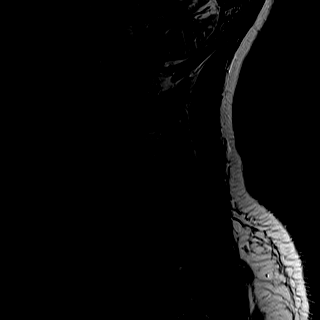
[im 15/15]
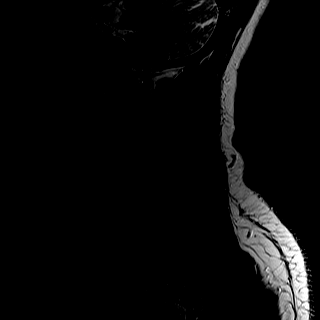

[Series 8: T2 · axial · 3.0mm · 0.70mm/px · z∈[-50,+44]mm · 9 of 26 slices shown (2 of 2)]
[im 1/26]
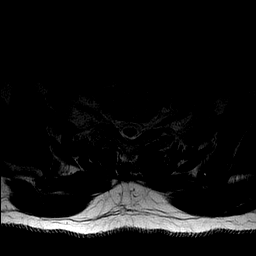
[im 5/26]
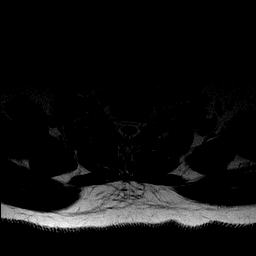
[im 9/26]
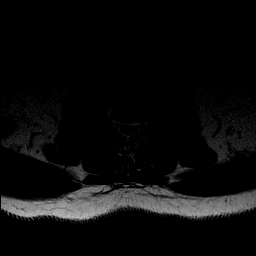
[im 11/26]
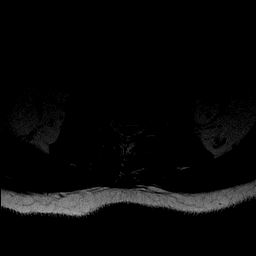
[im 13/26]
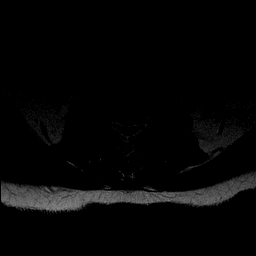
[im 15/26]
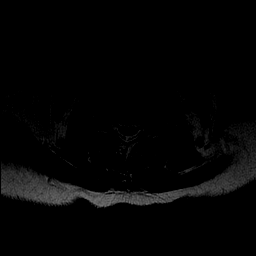
[im 17/26]
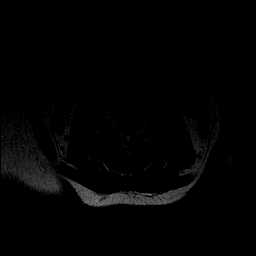
[im 21/26]
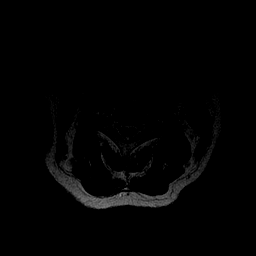
[im 26/26]
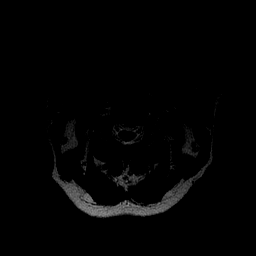

[Series 9: STIR · sagittal · 3.0mm · 0.35mm/px · 7 of 15 slices shown]
[im 1/15]
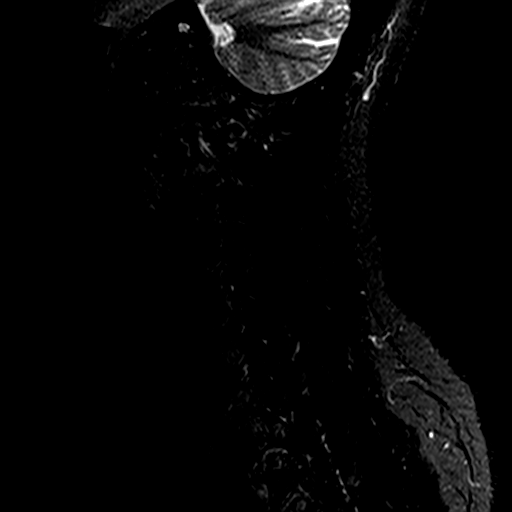
[im 3/15]
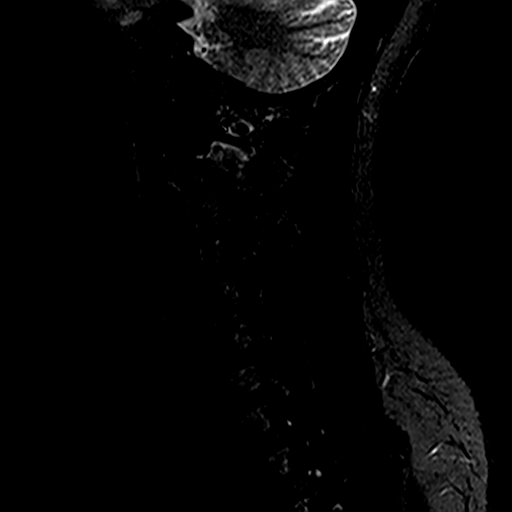
[im 5/15]
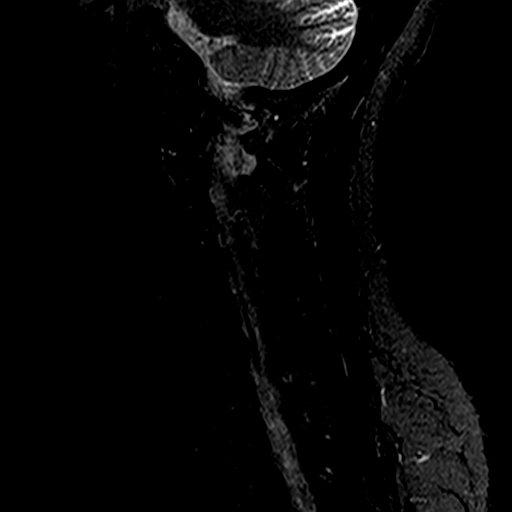
[im 8/15]
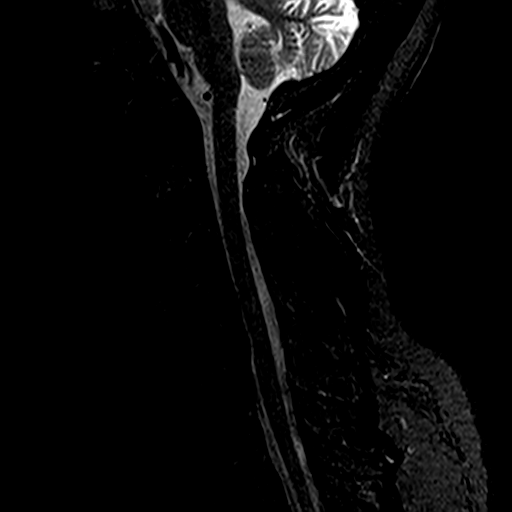
[im 10/15]
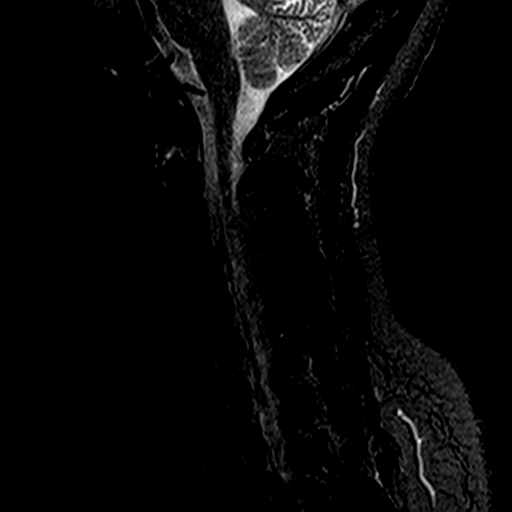
[im 12/15]
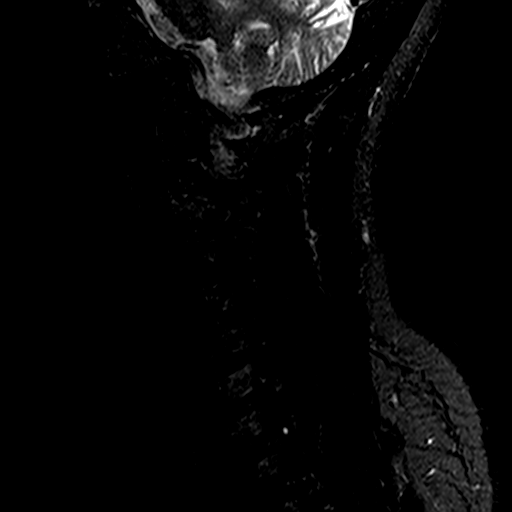
[im 15/15]
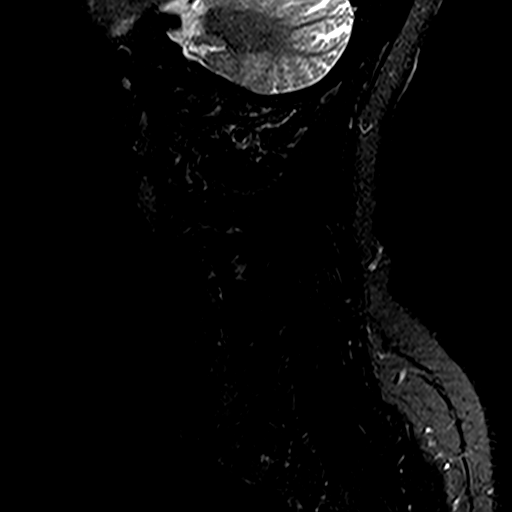

[Series 10: mpgr ax · axial · 3.0mm · 0.35mm/px · z∈[-50,-20]mm · 3 of 26 slices shown]
[im 1/26]
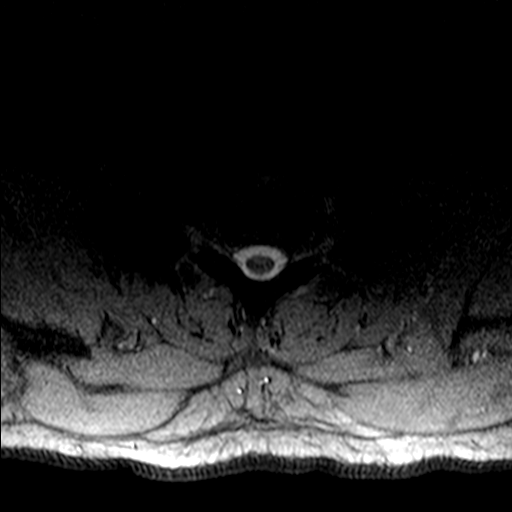
[im 5/26]
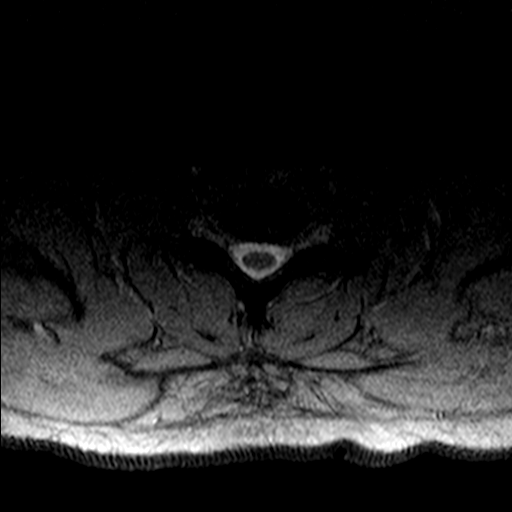
[im 9/26]
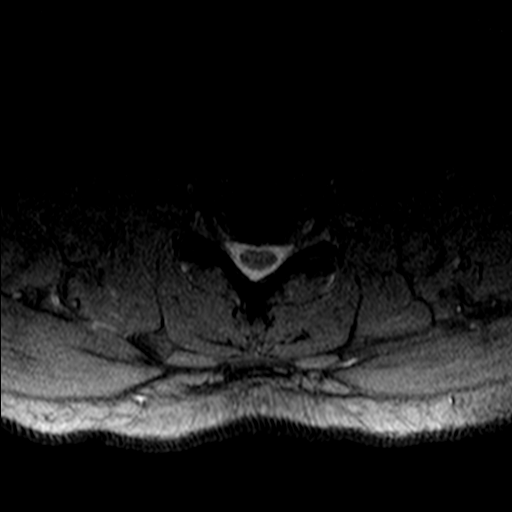

[34 of 48 positions shown; findings below may reference images not displayed]

FINDINGS: Brainstem and visualize cerebellum unremarkable. There is prepontine
signal differing from surrounding CSF at and to the left of midline
for example on images 8-10 of series 6 along the dorsum sella on all
sequences. This could represent pulsation artifact or a mass. No
obvious destructive lesion of the visualized portion of the clivus
or dorsum sella identified. This potential abnormality is not fully
included on today's exam.

There straightening of the normal cervical lordosis.

Despite efforts by the technologist and patient, motion artifact is
present on today's exam and could not be eliminated. This reduces
exam sensitivity and specificity. No significant abnormal spinal
cord signal is observed. No significant vertebral marrow edema is
identified. No vertebral subluxation is observed. Additional
findings at individual levels are as follows:

C2-3:  No impingement.  Shallow central disc protrusion.

C3-4:  Unremarkable.

C4-5:  No impingement.  Shallow central disc protrusion.

C5-6: Borderline right foraminal stenosis and borderline right
eccentric central narrowing of the thecal sac due to right
paracentral disc protrusion, uncinate spurring, and mild disc bulge.

C6-7:  No impingement.  Mild disc bulge.

C7-T1:  Unremarkable.
IMPRESSION: 1. No overt impingement, although there is borderline right central
narrowing of the thecal sac and borderline right foraminal narrowing
at C5-6 due to spondylosis and degenerative disc disease.
2. At the midline and to the left of midline, there is signal
heterogeneity in the pre pontine cistern behind the dorsum sellae.
Although this could be from pulsation artifact, I can't exclude a
mass. Admittedly this is a subtle finding, and not fully included on
today's exam, but I would recommend brain MRI with and without
contrast for definitive characterization to exclude malignancy.
These results will be called to the ordering clinician or
representative by the Radiologist Assistant, and communication
documented in the PACS or zVision Dashboard.

## 2017-11-29 IMAGING — MG MM DIGITAL DIAGNOSTIC UNILAT*R*
2 series · 2 of 2 positions shown · non-contrast
Comparison: Previous exam(s).

CLINICAL DATA: Post biopsy mammogram of the right breast for clip
placement.

EXAM:
DIAGNOSTIC RIGHT MAMMOGRAM POST STEREOTACTIC BIOPSY

[R CC]
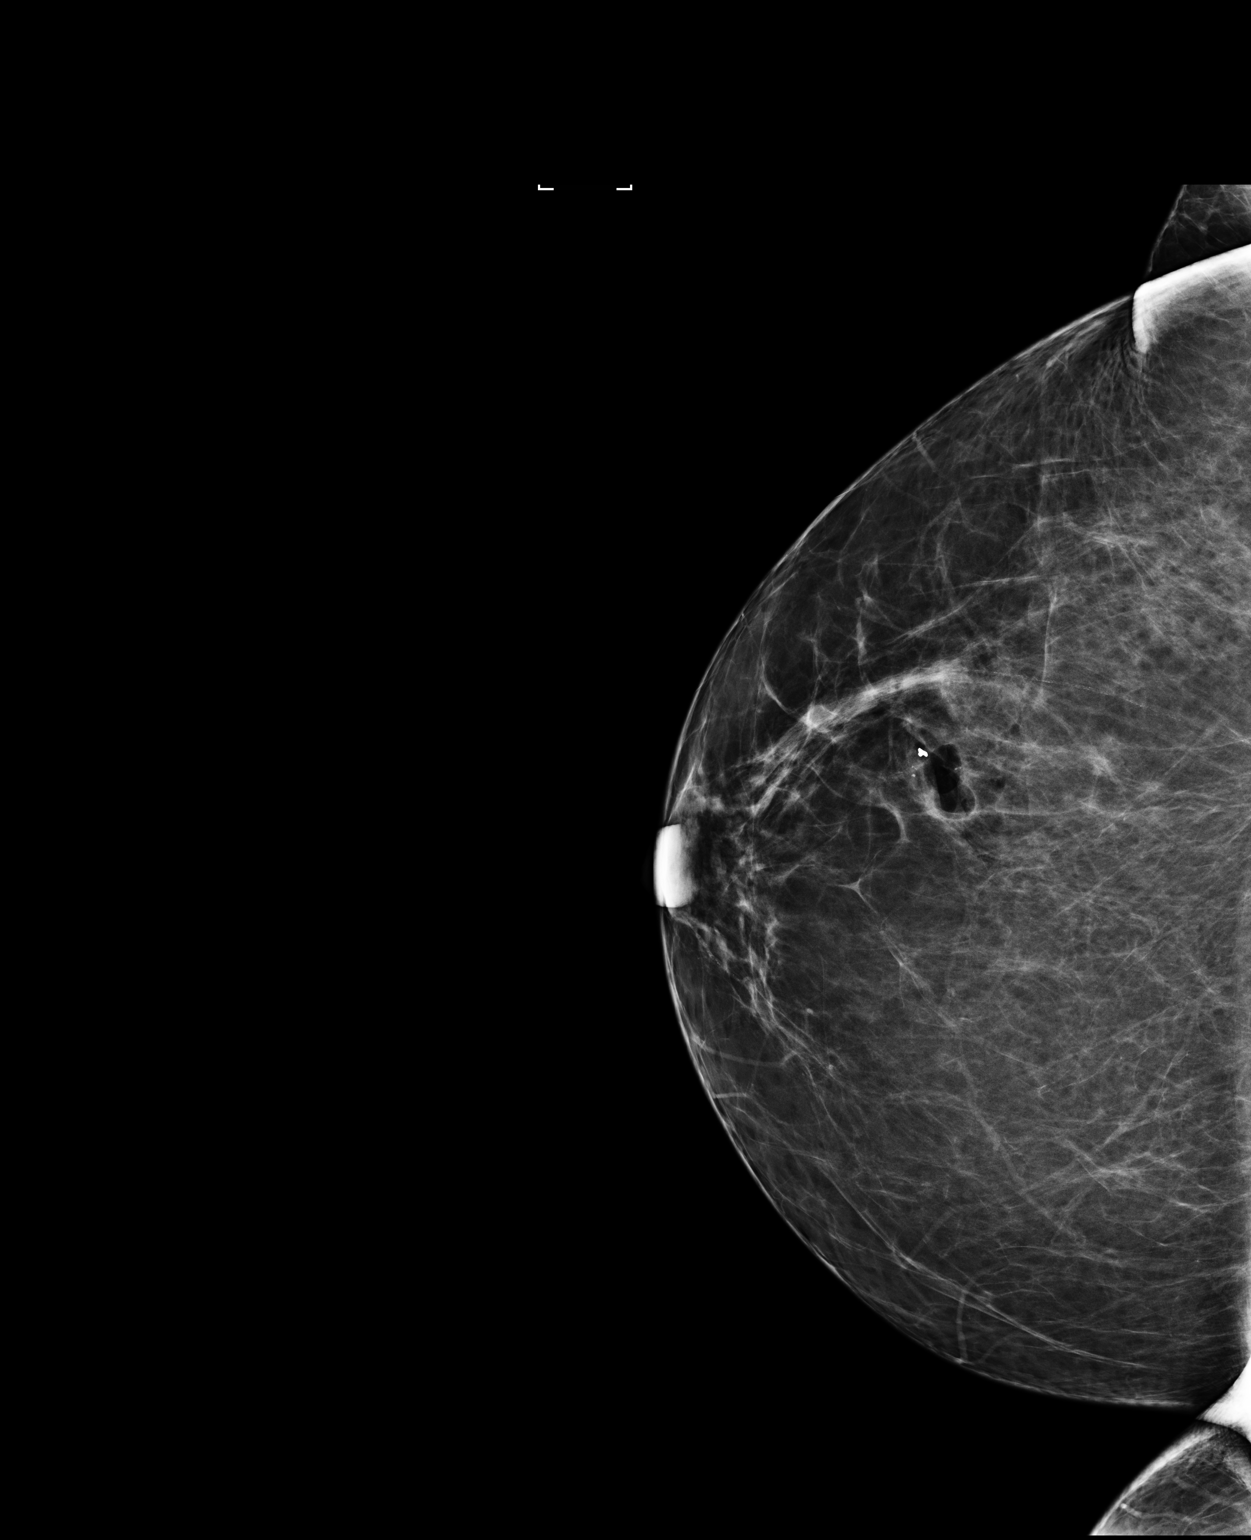

[R ML]
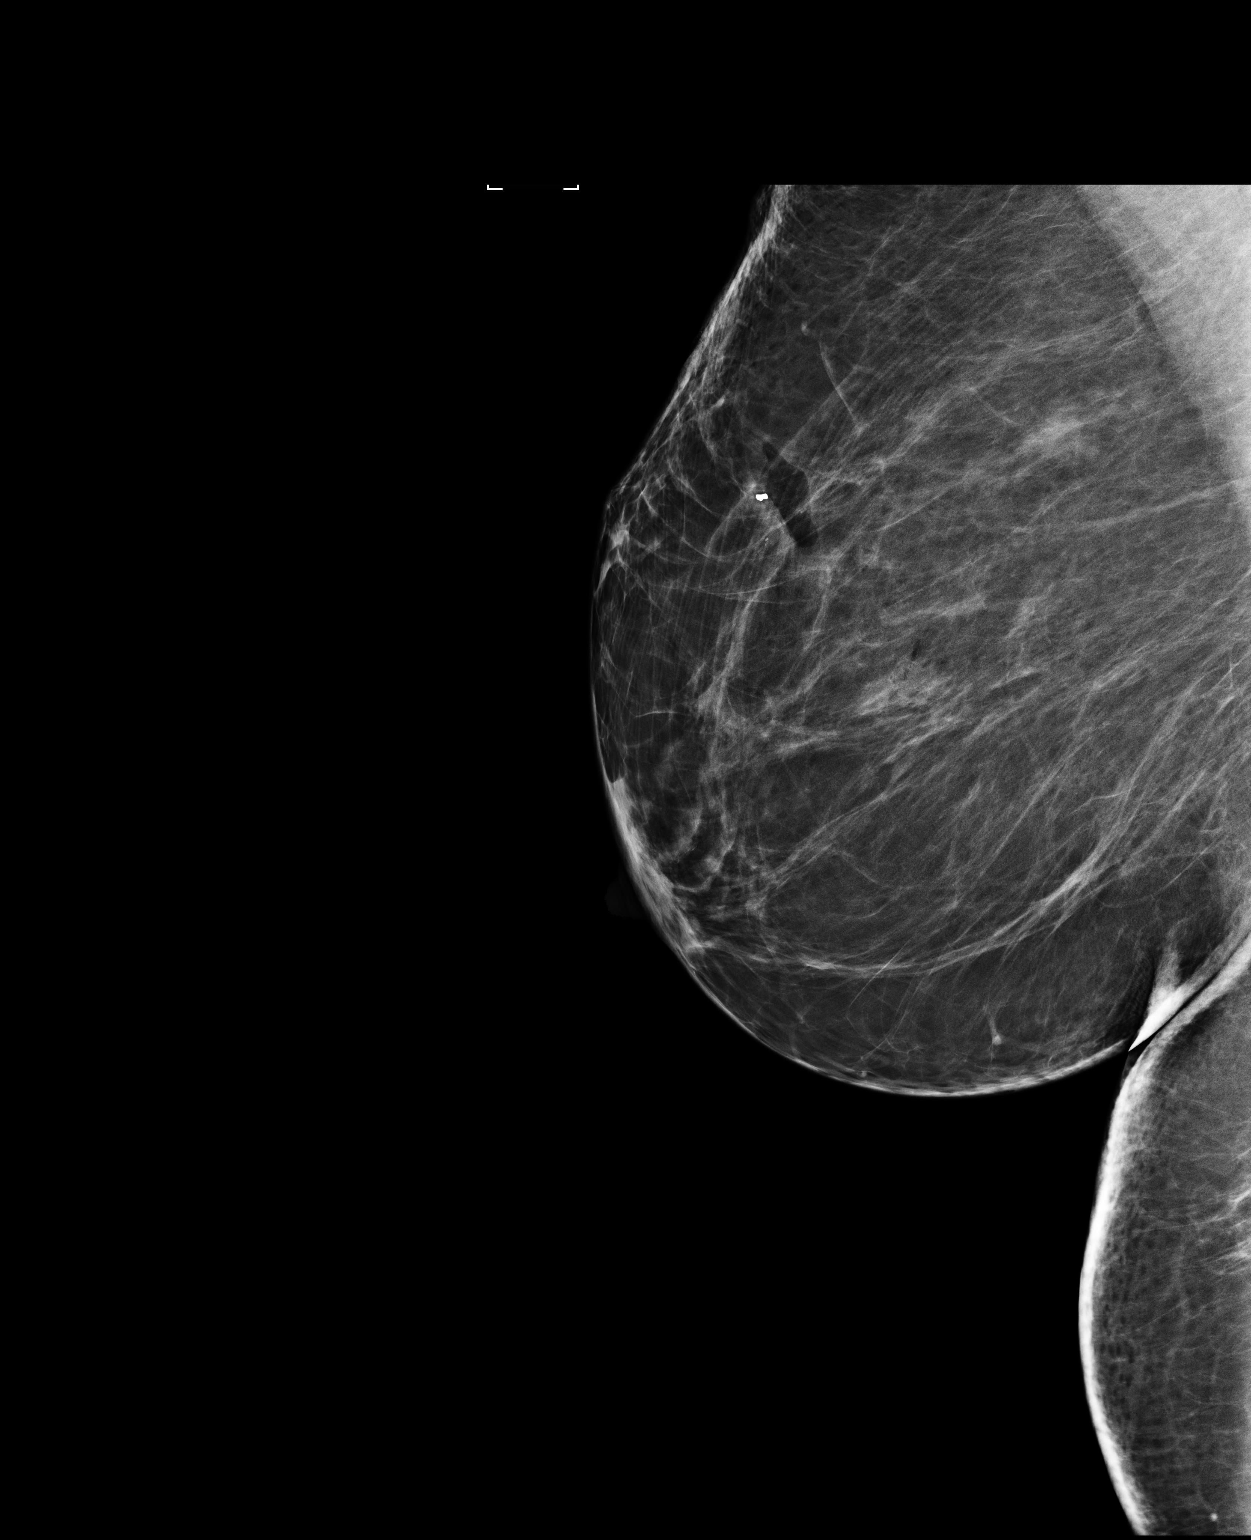

[2 of 2 positions shown; findings below may reference images not displayed]

FINDINGS: Mammographic images were obtained following stereotactic guided
biopsy of calcifications in the upper-outer right breast. The top
hat shaped biopsy marking clip is approximately 5 mm superior to the
the site biopsied calcifications in the upper-outer right breast. A
few faint calcifications remain at the site.
IMPRESSION: 5 mm of superior displacement of the top hat shaped biopsy marking
clip to the calcifications in the upper-outer right breast.

Final Assessment: Post Procedure Mammograms for Marker Placement

## 2017-11-29 IMAGING — MG MM BREAST BX W/ LOC DEV 1ST LESION IMAGE BX SPEC STEREO GUIDE*R*
1 series · 5 of 5 positions shown · non-contrast
Comparison: Previous exams.

ADDENDUM:
PATHOLOGY: DCIS, nuclear grade 2.

CONCORDANT: Yes
I telephoned the patient on 08/10/2015 at [DATE] p.m. and discussed
these results and the recommendations stated below. All questions
were answered. The patient denies significant pain or bleeding from
the biopsy site. Biopsy site care instructions were reviewed and the
patient was asked to call [HOSPITAL] [REDACTED] with any questions or
issues related to the biopsy.
RECOMMENDATION: Followup as per clinical treatment plan as patient
has an appointment to see the oncologist, Dr. Marul, tomorrow at
[DATE] p.m..
CLINICAL DATA: 64-year-old female presenting for stereotactic
biopsy of right breast calcifications.
EXAM:
RIGHT BREAST STEREOTACTIC CORE NEEDLE BIOPSY

[Series 1: R · right · 0.10mm/px · 5 of 5 slices shown]
[im 1/5]
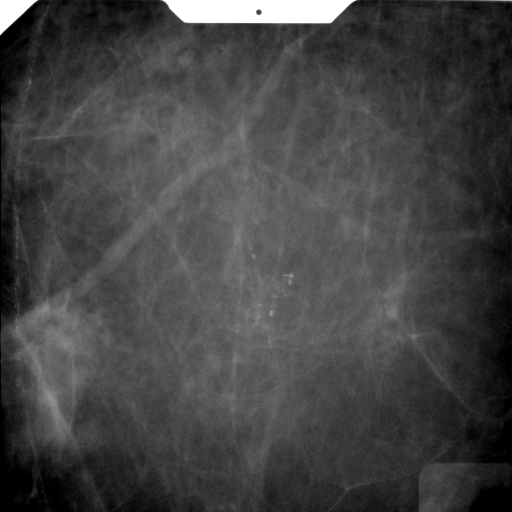
[im 2/5]
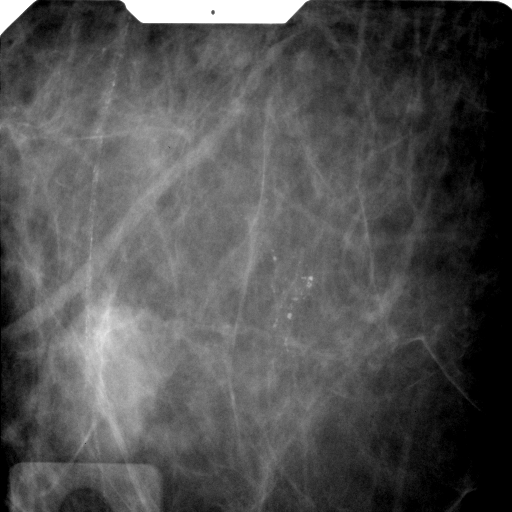
[im 3/5]
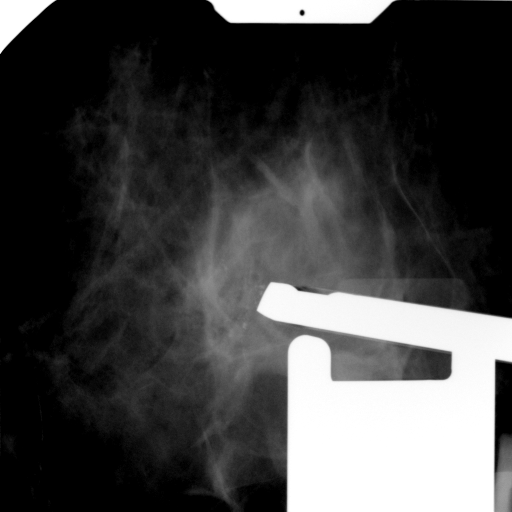
[im 4/5]
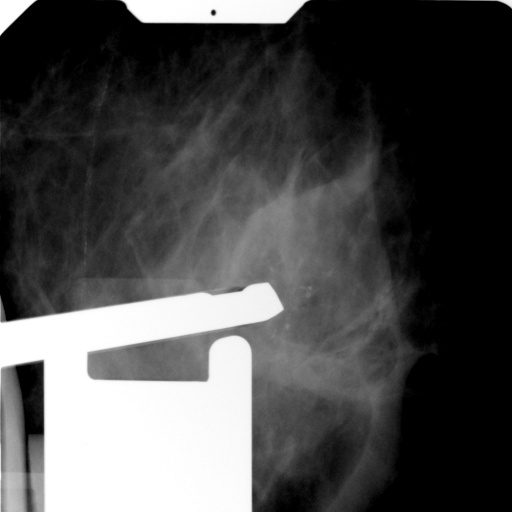
[im 5/5]
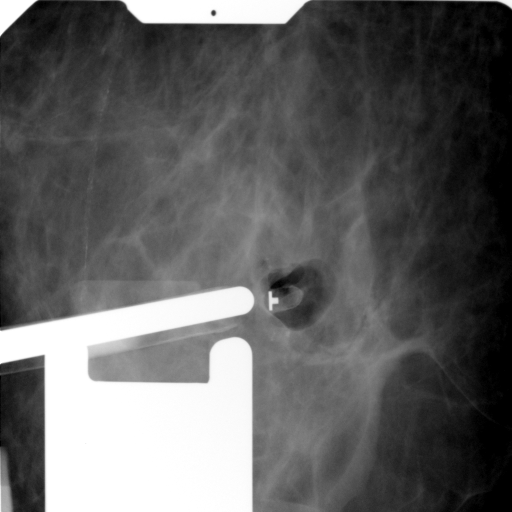

[5 of 5 positions shown; findings below may reference images not displayed]



Using sterile technique and 1% Lidocaine as local anesthetic, under
stereotactic guidance, a 9 gauge vacuum assisted device was used to
perform core needle biopsy of calcifications in the upper-outer
quadrant of the right breast using a superior approach. Specimen
radiograph was performed showing calcifications within multiple core
samples. Specimens with calcifications are identified for pathology.

At the conclusion of the procedure, a top hat shaped tissue marker
clip was deployed into the biopsy cavity. Follow-up 2-view mammogram
was performed and dictated separately.
IMPRESSION: Stereotactic-guided biopsy of calcifications in the upper-outer
right breast. No apparent complications.

## 2017-12-13 DIAGNOSIS — K219 Gastro-esophageal reflux disease without esophagitis: Secondary | ICD-10-CM | POA: Diagnosis not present

## 2017-12-13 DIAGNOSIS — Z7982 Long term (current) use of aspirin: Secondary | ICD-10-CM | POA: Diagnosis not present

## 2017-12-13 DIAGNOSIS — Z8371 Family history of colonic polyps: Secondary | ICD-10-CM | POA: Diagnosis not present

## 2017-12-13 DIAGNOSIS — R143 Flatulence: Secondary | ICD-10-CM | POA: Diagnosis not present

## 2017-12-20 ENCOUNTER — Ambulatory Visit: Payer: Medicare HMO | Admitting: Oncology

## 2017-12-20 DIAGNOSIS — Z8371 Family history of colonic polyps: Secondary | ICD-10-CM | POA: Diagnosis not present

## 2017-12-20 DIAGNOSIS — K219 Gastro-esophageal reflux disease without esophagitis: Secondary | ICD-10-CM | POA: Diagnosis not present

## 2017-12-20 DIAGNOSIS — I10 Essential (primary) hypertension: Secondary | ICD-10-CM | POA: Diagnosis not present

## 2017-12-20 DIAGNOSIS — Z1211 Encounter for screening for malignant neoplasm of colon: Secondary | ICD-10-CM | POA: Diagnosis not present

## 2017-12-20 DIAGNOSIS — E119 Type 2 diabetes mellitus without complications: Secondary | ICD-10-CM | POA: Diagnosis not present

## 2017-12-25 ENCOUNTER — Telehealth: Payer: Self-pay | Admitting: *Deleted

## 2017-12-25 NOTE — Telephone Encounter (Signed)
Patient requesting prescription be sent to [facility] for Mastectomy Beth Bender and Prosthesis, Phone number [phone]

## 2017-12-25 NOTE — Telephone Encounter (Signed)
Orders are ready to be faxed, business is closed today so will call back tomorrow to get fax number.

## 2017-12-26 NOTE — Telephone Encounter (Signed)
Fax number for [facility] is [phone]

## 2017-12-26 NOTE — Telephone Encounter (Signed)
Done, thank you!

## 2017-12-31 NOTE — Progress Notes (Signed)
Beth Bender  Telephone:([phone]) [phone] Fax:(336) [phone]  ID: Beth Bender OB: [birth year]  MR#: [id number]  ELF#:[id number]  Patient Care Team: Dion Body, MD as PCP - General (Family Medicine)  CHIEF COMPLAINT: Right upper outer quadrant ER/PR positive DCIS status post mastectomy.  INTERVAL HISTORY: Patient returns to clinic today for routine six-month evaluation.  She continues to tolerate tamoxifen well without significant side effects.  She currently feels well and is asymptomatic. She has no neurologic complaints. She denies any recent fevers or illnesses. She denies chest pain or shortness of breath. She has a good appetite and denies weight loss. She has no nausea, vomiting, constipation, or diarrhea. She has no urinary complaints.  Patient feels at her baseline offers no specific complaints today.  REVIEW OF SYSTEMS:   Review of Systems  Constitutional: Negative for fever, malaise/fatigue and weight loss.  Respiratory: Negative.  Negative for cough and shortness of breath.   Cardiovascular: Negative.  Negative for chest pain and leg swelling.  Gastrointestinal: Negative.  Negative for abdominal pain and constipation.  Genitourinary: Negative.   Musculoskeletal: Negative.   Skin: Negative.  Negative for rash.  Neurological: Negative.  Negative for sensory change, focal weakness and weakness.  Psychiatric/Behavioral: Negative.  The patient is not nervous/anxious.     As per HPI. Otherwise, a complete review of systems is negative.  PAST MEDICAL HISTORY: Past Medical History:  Diagnosis Date  . Anemia   . Breast cancer ([place]) 2017   right breast DCIS Grade II  . Cancer ([place]) 08/05/2015   right breast  . DCIS (ductal carcinoma in situ) of breast   . Depression    history of, no meds at present  . GERD (gastroesophageal reflux disease)   . Hypertension   . Mitral valve prolapse   . Neuropathy    right hand  . Post-menopausal     PAST SURGICAL  HISTORY: Past Surgical History:  Procedure Laterality Date  . ABDOMINAL HYSTERECTOMY    . BREAST BIOPSY Right 08/10/2015   DCIS GRADE II  . BUNIONECTOMY Left   . GANGLION CYST EXCISION Right    wrist  . MASTECTOMY Right 08/27/2015   right breast DCIS Grade II  . MASTECTOMY MODIFIED RADICAL Right 08/27/2015   Procedure: MASTECTOMY MODIFIED RADICAL;  Surgeon: Leonie Green, MD;  Location: [facility] ORS;  Service: General;  Laterality: Right;  . SIMPLE MASTECTOMY WITH AXILLARY SENTINEL NODE BIOPSY Right 08/27/2015   Procedure: SIMPLE MASTECTOMY WITH AXILLARY SENTINEL NODE BIOPSY;  Surgeon: Leonie Green, MD;  Location: [facility] ORS;  Service: General;  Laterality: Right;    FAMILY HISTORY Family History  Problem Relation Age of Onset  . Breast cancer Maternal Aunt 90  . Colon cancer Maternal Uncle 48  . Cancer - Other Mother 50       sinus       ADVANCED DIRECTIVES:    HEALTH MAINTENANCE: Social History   Tobacco Use  . Smoking status: Never Smoker  . Smokeless tobacco: Never Used  Substance Use Topics  . Alcohol use: No    Alcohol/week: 0.0 standard drinks  . Drug use: No     Colonoscopy:  PAP:  Bone density:  Lipid panel:  Allergies  Allergen Reactions  . Hydrocodone Rash    Current Outpatient Medications  Medication Sig Dispense Refill  . aspirin EC 81 MG tablet Take by mouth.    . Cholecalciferol (VITAMIN D3) 2000 units capsule Take 2,000 Units by mouth daily.     Marland Kitchen  metoprolol succinate (TOPROL-XL) 25 MG 24 hr tablet Take 25 mg by mouth daily.     . tamoxifen (NOLVADEX) 20 MG tablet TAKE 1 TABLET (20 MG TOTAL) BY MOUTH DAILY. 90 tablet 0  . triamterene-hydrochlorothiazide (DYAZIDE) 37.5-25 MG capsule Take 1 capsule by mouth daily.      No current facility-administered medications for this visit.     OBJECTIVE: Vitals:   01/03/18 0959 01/03/18 1003  BP:  (!) 143/84  Pulse:  (!) 57  Resp: 12   Temp:  98 F (36.7 C)     Body mass index is 36.72  kg/m.    ECOG FS:0 - Asymptomatic  General: Well-developed, well-nourished, no acute distress. Eyes: Pink conjunctiva, anicteric sclera. HEENT: Normocephalic, moist mucous membranes. Breast: Right mastectomy without evidence of recurrence.  Left breast and axilla without lumps or masses. Lungs: Clear to auscultation bilaterally. Heart: Regular rate and rhythm. No rubs, murmurs, or gallops. Abdomen: Soft, nontender, nondistended. No organomegaly noted, normoactive bowel sounds. Musculoskeletal: No edema, cyanosis, or clubbing. Neuro: Alert, answering all questions appropriately. Cranial nerves grossly intact. Skin: No rashes or petechiae noted. Psych: Normal affect.  LAB RESULTS:  Lab Results  Component Value Date   NA 142 08/27/2015   K 3.4 (L) 08/27/2015   CL 103 08/18/2015   CO2 27 08/18/2015   GLUCOSE 115 (H) 08/27/2015   BUN 19 08/18/2015   CREATININE 0.98 08/18/2015   CALCIUM 9.6 08/18/2015   GFRNONAA 59 (L) 08/18/2015   GFRAA >60 08/18/2015    Lab Results  Component Value Date   WBC 5.4 08/18/2015   HGB 11.6 (L) 08/27/2015   HCT 34.0 (L) 08/27/2015   MCV 82.7 08/18/2015   PLT 172 08/18/2015     STUDIES: No results found.  ASSESSMENT: Right upper outer breast ER/PR positive DCIS status post mastectomy.  PLAN:    1. Right upper outer breast ER/PR positive DCIS status post mastectomy: Patient had her mastectomy on Aug 04, 2015 in which final pathology did not reveal an invasive component.  She then underwent sentinel node excision on August 27, 2015 that was negative for disease.  Because she had a mastectomy, she did not require adjuvant XRT.  Continue tamoxifen which she will take for total 5 years completing in July 2022.  Patient's most recent unilateral left mammogram on Jul 13, 2017 was reported as BI-RADS 1.  Repeat in May 2020.  Return to clinic in 6 months for routine evaluation.    I spent a total of 20 minutes face-to-face with the patient of which greater  than 50% of the visit was spent in counseling and coordination of care as detailed above.   Patient expressed understanding and was in agreement with this plan. She also understands that She can call clinic at any time with any questions, concerns, or complaints.   Ductal carcinoma in situ (DCIS) of right breast   Staging form: Breast, AJCC 7th Edition     Pathologic stage from 08/16/2015: Stage 0 (Tis (DCIS), N0, M0) - Signed by Lloyd Huger, MD on 08/16/2015   Lloyd Huger, MD   01/05/2018 12:57 PM

## 2018-01-03 ENCOUNTER — Other Ambulatory Visit: Payer: Self-pay

## 2018-01-03 ENCOUNTER — Inpatient Hospital Stay: Payer: Medicare HMO | Attending: Oncology | Admitting: Oncology

## 2018-01-03 ENCOUNTER — Encounter: Payer: Self-pay | Admitting: Oncology

## 2018-01-03 VITALS — BP 143/84 | HR 57 | Temp 98.0°F | Resp 12 | Ht 63.0 in | Wt 207.3 lb

## 2018-01-03 DIAGNOSIS — Z7982 Long term (current) use of aspirin: Secondary | ICD-10-CM

## 2018-01-03 DIAGNOSIS — Z170 Estrogen receptor positive status [ER+]: Secondary | ICD-10-CM | POA: Diagnosis not present

## 2018-01-03 DIAGNOSIS — I10 Essential (primary) hypertension: Secondary | ICD-10-CM | POA: Diagnosis not present

## 2018-01-03 DIAGNOSIS — D0511 Intraductal carcinoma in situ of right breast: Secondary | ICD-10-CM

## 2018-01-03 DIAGNOSIS — Z9011 Acquired absence of right breast and nipple: Secondary | ICD-10-CM

## 2018-01-03 DIAGNOSIS — Z79899 Other long term (current) drug therapy: Secondary | ICD-10-CM

## 2018-01-03 NOTE — Progress Notes (Signed)
Patient here for follow up she has no complaints today her last clinical breast exam was in May.

## 2018-01-27 DIAGNOSIS — C50911 Malignant neoplasm of unspecified site of right female breast: Secondary | ICD-10-CM | POA: Diagnosis not present

## 2018-03-23 DIAGNOSIS — R7303 Prediabetes: Secondary | ICD-10-CM | POA: Diagnosis not present

## 2018-03-23 DIAGNOSIS — I10 Essential (primary) hypertension: Secondary | ICD-10-CM | POA: Diagnosis not present

## 2018-03-23 DIAGNOSIS — E7800 Pure hypercholesterolemia, unspecified: Secondary | ICD-10-CM | POA: Diagnosis not present

## 2018-03-30 DIAGNOSIS — I10 Essential (primary) hypertension: Secondary | ICD-10-CM | POA: Diagnosis not present

## 2018-03-30 DIAGNOSIS — R7303 Prediabetes: Secondary | ICD-10-CM | POA: Diagnosis not present

## 2018-03-30 DIAGNOSIS — F3340 Major depressive disorder, recurrent, in remission, unspecified: Secondary | ICD-10-CM | POA: Diagnosis not present

## 2018-03-30 DIAGNOSIS — Z862 Personal history of diseases of the blood and blood-forming organs and certain disorders involving the immune mechanism: Secondary | ICD-10-CM | POA: Diagnosis not present

## 2018-03-30 DIAGNOSIS — Z6836 Body mass index (BMI) 36.0-36.9, adult: Secondary | ICD-10-CM | POA: Diagnosis not present

## 2018-03-30 DIAGNOSIS — D696 Thrombocytopenia, unspecified: Secondary | ICD-10-CM | POA: Diagnosis not present

## 2018-03-30 DIAGNOSIS — Z0000 Encounter for general adult medical examination without abnormal findings: Secondary | ICD-10-CM | POA: Diagnosis not present

## 2018-03-30 DIAGNOSIS — E6609 Other obesity due to excess calories: Secondary | ICD-10-CM | POA: Diagnosis not present

## 2018-07-02 ENCOUNTER — Other Ambulatory Visit: Payer: Self-pay | Admitting: Family Medicine

## 2018-07-02 DIAGNOSIS — Z1231 Encounter for screening mammogram for malignant neoplasm of breast: Secondary | ICD-10-CM

## 2018-07-05 ENCOUNTER — Inpatient Hospital Stay: Payer: Medicare HMO | Admitting: Oncology

## 2018-08-17 ENCOUNTER — Other Ambulatory Visit: Payer: Self-pay

## 2018-08-19 NOTE — Progress Notes (Deleted)
[facility]  Telephone:(336) [phone] Fax:(336) [phone]  ID: Beth Bender OB: Feb 11, 1951  MR#: [id number]  GUY#:[id number]  Patient Care Team: Dion Body, MD as PCP - General (Family Medicine)  I connected with Beth Bender on 08/19/18 at 10:15 AM EDT by {Blank single:19197::"video enabled telemedicine visit","telephone visit"} and verified that I am speaking with the correct person using two identifiers.   I discussed the limitations, risks, security and privacy concerns of performing an evaluation and management service by telemedicine and the availability of in-person appointments. I also discussed with the patient that there may be a patient responsible charge related to this service. The patient expressed understanding and agreed to proceed.   Other persons participating in the visit and their role in the encounter: ***   Patient's location: ***  Provider's location: ***   CHIEF COMPLAINT: Right upper outer quadrant ER/PR positive DCIS status post mastectomy.  INTERVAL HISTORY: Patient returns to clinic today for routine six-month evaluation.  She continues to tolerate tamoxifen well without significant side effects.  She currently feels well and is asymptomatic. She has no neurologic complaints. She denies any recent fevers or illnesses. She denies chest pain or shortness of breath. She has a good appetite and denies weight loss. She has no nausea, vomiting, constipation, or diarrhea. She has no urinary complaints.  Patient feels at her baseline offers no specific complaints today.  REVIEW OF SYSTEMS:   Review of Systems  Constitutional: Negative for fever, malaise/fatigue and weight loss.  Respiratory: Negative.  Negative for cough and shortness of breath.   Cardiovascular: Negative.  Negative for chest pain and leg swelling.  Gastrointestinal: Negative.  Negative for abdominal pain and constipation.  Genitourinary: Negative.   Musculoskeletal:  Negative.   Skin: Negative.  Negative for rash.  Neurological: Negative.  Negative for sensory change, focal weakness and weakness.  Psychiatric/Behavioral: Negative.  The patient is not nervous/anxious.     As per HPI. Otherwise, a complete review of systems is negative.  PAST MEDICAL HISTORY: Past Medical History:  Diagnosis Date  . Anemia   . Breast cancer ([place]) 2017   right breast DCIS Grade II  . Cancer ([place]) 08/05/2015   right breast  . DCIS (ductal carcinoma in situ) of breast   . Depression    history of, no meds at present  . GERD (gastroesophageal reflux disease)   . Hypertension   . Mitral valve prolapse   . Neuropathy    right hand  . Post-menopausal     PAST SURGICAL HISTORY: Past Surgical History:  Procedure Laterality Date  . ABDOMINAL HYSTERECTOMY    . BREAST BIOPSY Right 08/10/2015   DCIS GRADE II  . BUNIONECTOMY Left   . GANGLION CYST EXCISION Right    wrist  . MASTECTOMY Right 08/27/2015   right breast DCIS Grade II  . MASTECTOMY MODIFIED RADICAL Right 08/27/2015   Procedure: MASTECTOMY MODIFIED RADICAL;  Surgeon: Leonie Green, MD;  Location: [facility] ORS;  Service: General;  Laterality: Right;  . SIMPLE MASTECTOMY WITH AXILLARY SENTINEL NODE BIOPSY Right 08/27/2015   Procedure: SIMPLE MASTECTOMY WITH AXILLARY SENTINEL NODE BIOPSY;  Surgeon: Leonie Green, MD;  Location: [facility] ORS;  Service: General;  Laterality: Right;    FAMILY HISTORY Family History  Problem Relation Age of Onset  . Breast cancer Maternal Aunt 90  . Colon cancer Maternal Uncle 47  . Cancer - Other Mother 34       sinus  ADVANCED DIRECTIVES:    HEALTH MAINTENANCE: Social History   Tobacco Use  . Smoking status: Never Smoker  . Smokeless tobacco: Never Used  Substance Use Topics  . Alcohol use: No    Alcohol/week: 0.0 standard drinks  . Drug use: No     Colonoscopy:  PAP:  Bone density:  Lipid panel:  Allergies  Allergen Reactions  . Hydrocodone  Rash    Current Outpatient Medications  Medication Sig Dispense Refill  . aspirin EC 81 MG tablet Take by mouth.    . Cholecalciferol (VITAMIN D3) 2000 units capsule Take 2,000 Units by mouth daily.     . metoprolol succinate (TOPROL-XL) 25 MG 24 hr tablet Take 25 mg by mouth daily.     . tamoxifen (NOLVADEX) 20 MG tablet TAKE 1 TABLET (20 MG TOTAL) BY MOUTH DAILY. 90 tablet 0  . triamterene-hydrochlorothiazide (DYAZIDE) 37.5-25 MG capsule Take 1 capsule by mouth daily.      No current facility-administered medications for this visit.     OBJECTIVE: There were no vitals filed for this visit.   There is no height or weight on file to calculate BMI.    ECOG FS:0 - Asymptomatic  General: Well-developed, well-nourished, no acute distress. Eyes: Pink conjunctiva, anicteric sclera. HEENT: Normocephalic, moist mucous membranes. Breast: Right mastectomy without evidence of recurrence.  Left breast and axilla without lumps or masses. Lungs: Clear to auscultation bilaterally. Heart: Regular rate and rhythm. No rubs, murmurs, or gallops. Abdomen: Soft, nontender, nondistended. No organomegaly noted, normoactive bowel sounds. Musculoskeletal: No edema, cyanosis, or clubbing. Neuro: Alert, answering all questions appropriately. Cranial nerves grossly intact. Skin: No rashes or petechiae noted. Psych: Normal affect.  LAB RESULTS:  Lab Results  Component Value Date   NA 142 08/27/2015   K 3.4 (L) 08/27/2015   CL 103 08/18/2015   CO2 27 08/18/2015   GLUCOSE 115 (H) 08/27/2015   BUN 19 08/18/2015   CREATININE 0.98 08/18/2015   CALCIUM 9.6 08/18/2015   GFRNONAA 59 (L) 08/18/2015   GFRAA >60 08/18/2015    Lab Results  Component Value Date   WBC 5.4 08/18/2015   HGB 11.6 (L) 08/27/2015   HCT 34.0 (L) 08/27/2015   MCV 82.7 08/18/2015   PLT 172 08/18/2015     STUDIES: No results found.  ASSESSMENT: Right upper outer breast ER/PR positive DCIS status post mastectomy.  PLAN:     1. Right upper outer breast ER/PR positive DCIS status post mastectomy: Patient had her mastectomy on Aug 04, 2015 in which final pathology did not reveal an invasive component.  She then underwent sentinel node excision on August 27, 2015 that was negative for disease.  Because she had a mastectomy, she did not require adjuvant XRT.  Continue tamoxifen which she will take for total 5 years completing in July 2022.  Patient's most recent unilateral left mammogram on Jul 13, 2017 was reported as BI-RADS 1.  Repeat in May 2020.  Return to clinic in 6 months for routine evaluation.    I provided *** minutes of {Blank single:19197::"face-to-face video visit time","non face-to-face telephone visit time"} during this encounter, and > 50% was spent counseling as documented under my assessment & plan.    Patient expressed understanding and was in agreement with this plan. She also understands that She can call clinic at any time with any questions, concerns, or complaints.   Ductal carcinoma in situ (DCIS) of right breast   Staging form: Breast, AJCC 7th Edition  Pathologic stage from 08/16/2015: Stage 0 (Tis (DCIS), N0, M0) - Signed by Lloyd Huger, MD on 08/16/2015   Lloyd Huger, MD   08/19/2018 8:56 AM

## 2018-08-20 ENCOUNTER — Inpatient Hospital Stay: Payer: Medicare HMO | Admitting: Oncology

## 2018-09-04 DIAGNOSIS — Z853 Personal history of malignant neoplasm of breast: Secondary | ICD-10-CM | POA: Diagnosis not present

## 2018-09-05 ENCOUNTER — Ambulatory Visit
Admission: RE | Admit: 2018-09-05 | Discharge: 2018-09-05 | Disposition: A | Discharge status: Home / Self Care | Payer: Medicare HMO | Source: Ambulatory Visit | Attending: Family Medicine | Admitting: Family Medicine

## 2018-09-05 ENCOUNTER — Other Ambulatory Visit: Payer: Self-pay

## 2018-09-05 DIAGNOSIS — Z1231 Encounter for screening mammogram for malignant neoplasm of breast: Secondary | ICD-10-CM

## 2018-09-20 ENCOUNTER — Encounter: Payer: Self-pay | Admitting: Oncology

## 2018-09-20 ENCOUNTER — Inpatient Hospital Stay: Payer: Medicare HMO | Attending: Oncology | Admitting: Oncology

## 2018-09-20 ENCOUNTER — Other Ambulatory Visit: Payer: Self-pay

## 2018-09-20 VITALS — BP 145/70 | HR 59 | Temp 97.4°F | Ht 64.0 in | Wt 205.4 lb

## 2018-09-20 DIAGNOSIS — Z808 Family history of malignant neoplasm of other organs or systems: Secondary | ICD-10-CM | POA: Insufficient documentation

## 2018-09-20 DIAGNOSIS — Z9011 Acquired absence of right breast and nipple: Secondary | ICD-10-CM | POA: Insufficient documentation

## 2018-09-20 DIAGNOSIS — D0511 Intraductal carcinoma in situ of right breast: Secondary | ICD-10-CM | POA: Diagnosis not present

## 2018-09-20 DIAGNOSIS — Z800 Family history of malignant neoplasm of digestive organs: Secondary | ICD-10-CM | POA: Diagnosis not present

## 2018-09-20 DIAGNOSIS — Z79810 Long term (current) use of selective estrogen receptor modulators (SERMs): Secondary | ICD-10-CM | POA: Diagnosis not present

## 2018-09-20 DIAGNOSIS — Z170 Estrogen receptor positive status [ER+]: Secondary | ICD-10-CM | POA: Insufficient documentation

## 2018-09-20 DIAGNOSIS — Z803 Family history of malignant neoplasm of breast: Secondary | ICD-10-CM | POA: Insufficient documentation

## 2018-09-20 NOTE — Progress Notes (Signed)
Patient stated that she had been doing well, except that some days she has some itching on her right chest. Patient would also like to know if she needs to have her right breast checked for any malignancy. Patient denied nipple discharge, skin discoloration or pain. Mammogram was done on 09/05/2018.

## 2018-09-21 DIAGNOSIS — I10 Essential (primary) hypertension: Secondary | ICD-10-CM | POA: Diagnosis not present

## 2018-09-21 DIAGNOSIS — Z862 Personal history of diseases of the blood and blood-forming organs and certain disorders involving the immune mechanism: Secondary | ICD-10-CM | POA: Diagnosis not present

## 2018-09-21 DIAGNOSIS — D696 Thrombocytopenia, unspecified: Secondary | ICD-10-CM | POA: Diagnosis not present

## 2018-09-21 DIAGNOSIS — R7303 Prediabetes: Secondary | ICD-10-CM | POA: Diagnosis not present

## 2018-09-21 NOTE — Progress Notes (Signed)
[place]  Telephone:(336) [phone] Fax:(336) [phone]  ID: Beth Bender OB: 1950-09-21  MR#: [id number]  GYK#:[id number]  Patient Care Team: Dion Body, MD as PCP - General (Family Medicine)  CHIEF COMPLAINT: Right upper outer quadrant ER/PR positive DCIS status post mastectomy.  INTERVAL HISTORY: Patient returns to clinic today for routine 79-month evaluation.  She currently feels well and is asymptomatic.  She continues to tolerate tamoxifen well without significant side effects. She has no neurologic complaints. She denies any recent fevers or illnesses.  She denies any chest pain, shortness of breath, cough, or hemoptysis.  She has a good appetite and denies weight loss. She has no nausea, vomiting, constipation, or diarrhea. She has no urinary complaints.  Patient feels at her baseline offers no specific complaints today.  REVIEW OF SYSTEMS:   Review of Systems  Constitutional: Negative.  Negative for fever, malaise/fatigue and weight loss.  Respiratory: Negative.  Negative for cough, hemoptysis and shortness of breath.   Cardiovascular: Negative.  Negative for chest pain and leg swelling.  Gastrointestinal: Negative.  Negative for abdominal pain and constipation.  Genitourinary: Negative.  Negative for dysuria.  Musculoskeletal: Negative.  Negative for back pain.  Skin: Negative.  Negative for rash.  Neurological: Negative.  Negative for dizziness, sensory change, focal weakness, weakness and headaches.  Psychiatric/Behavioral: Negative.  The patient is not nervous/anxious.     As per HPI. Otherwise, a complete review of systems is negative.  PAST MEDICAL HISTORY: Past Medical History:  Diagnosis Date  . Anemia   . Breast cancer ([place]) 2017   right breast DCIS Grade II  . Cancer ([place]) 08/05/2015   right breast  . DCIS (ductal carcinoma in situ) of breast   . Depression    history of, no meds at present  . GERD (gastroesophageal reflux disease)    . Hypertension   . Mitral valve prolapse   . Neuropathy    right hand  . Post-menopausal     PAST SURGICAL HISTORY: Past Surgical History:  Procedure Laterality Date  . ABDOMINAL HYSTERECTOMY    . BREAST BIOPSY Right 08/10/2015   DCIS GRADE II  . BUNIONECTOMY Left   . GANGLION CYST EXCISION Right    wrist  . MASTECTOMY Right 08/27/2015   right breast DCIS Grade II  . MASTECTOMY MODIFIED RADICAL Right 08/27/2015   Procedure: MASTECTOMY MODIFIED RADICAL;  Surgeon: Leonie Green, MD;  Location: [facility] ORS;  Service: General;  Laterality: Right;  . SIMPLE MASTECTOMY WITH AXILLARY SENTINEL NODE BIOPSY Right 08/27/2015   Procedure: SIMPLE MASTECTOMY WITH AXILLARY SENTINEL NODE BIOPSY;  Surgeon: Leonie Green, MD;  Location: [facility] ORS;  Service: General;  Laterality: Right;    FAMILY HISTORY Family History  Problem Relation Age of Onset  . Breast cancer Maternal Aunt 90  . Colon cancer Maternal Uncle 3  . Cancer - Other Mother 31       sinus       ADVANCED DIRECTIVES:    HEALTH MAINTENANCE: Social History   Tobacco Use  . Smoking status: Never Smoker  . Smokeless tobacco: Never Used  Substance Use Topics  . Alcohol use: No    Alcohol/week: 0.0 standard drinks  . Drug use: No     Colonoscopy:  PAP:  Bone density:  Lipid panel:  Allergies  Allergen Reactions  . Hydrocodone Rash    Current Outpatient Medications  Medication Sig Dispense Refill  . aspirin EC 81 MG tablet Take by mouth.    Marland Kitchen  Cholecalciferol (VITAMIN D3) 2000 units capsule Take 2,000 Units by mouth daily.     . metoprolol succinate (TOPROL-XL) 25 MG 24 hr tablet Take 25 mg by mouth daily.     . tamoxifen (NOLVADEX) 20 MG tablet TAKE 1 TABLET (20 MG TOTAL) BY MOUTH DAILY. 90 tablet 0  . triamterene-hydrochlorothiazide (DYAZIDE) 37.5-25 MG capsule Take 1 capsule by mouth daily.      No current facility-administered medications for this visit.     OBJECTIVE: Vitals:   09/20/18 1129   BP: (!) 145/70  Pulse: (!) 59  Temp: (!) 97.4 F (36.3 C)     Body mass index is 35.26 kg/m.    ECOG FS:0 - Asymptomatic  General: Well-developed, well-nourished, no acute distress. Eyes: Pink conjunctiva, anicteric sclera. HEENT: Normocephalic, moist mucous membranes. Breast: Right mastectomy. Lungs: Clear to auscultation bilaterally. Heart: Regular rate and rhythm. No rubs, murmurs, or gallops. Abdomen: Soft, nontender, nondistended. No organomegaly noted, normoactive bowel sounds. Musculoskeletal: No edema, cyanosis, or clubbing. Neuro: Alert, answering all questions appropriately. Cranial nerves grossly intact. Skin: No rashes or petechiae noted. Psych: Normal affect.  LAB RESULTS:  Lab Results  Component Value Date   NA 142 08/27/2015   K 3.4 (L) 08/27/2015   CL 103 08/18/2015   CO2 27 08/18/2015   GLUCOSE 115 (H) 08/27/2015   BUN 19 08/18/2015   CREATININE 0.98 08/18/2015   CALCIUM 9.6 08/18/2015   GFRNONAA 59 (L) 08/18/2015   GFRAA >60 08/18/2015    Lab Results  Component Value Date   WBC 5.4 08/18/2015   HGB 11.6 (L) 08/27/2015   HCT 34.0 (L) 08/27/2015   MCV 82.7 08/18/2015   PLT 172 08/18/2015     STUDIES: Mm 3d Screen Breast Uni Left  Result Date: 09/05/2018 CLINICAL DATA:  Screening. EXAM: DIGITAL SCREENING UNILATERAL LEFT MAMMOGRAM WITH CAD AND TOMO COMPARISON:  Previous exam(s). ACR Breast Density Category b: There are scattered areas of fibroglandular density. FINDINGS: The patient has had a right mastectomy. There are no findings suspicious for malignancy. Images were processed with CAD. IMPRESSION: No mammographic evidence of malignancy. A result letter of this screening mammogram will be mailed directly to the patient. RECOMMENDATION: Screening mammogram in one year.  (Code:SM-L-6M) BI-RADS CATEGORY  1: Negative. Electronically Signed   By: Margarette Canada M.D.   On: 09/05/2018 13:44    ASSESSMENT: Right upper outer breast ER/PR positive DCIS status  post mastectomy.  PLAN:    1. Right upper outer breast ER/PR positive DCIS status post mastectomy: Patient had her mastectomy on Aug 04, 2015 in which final pathology did not reveal an invasive component.  She then underwent sentinel node excision on August 27, 2015 that was negative for disease.  Because she had a mastectomy, she did not require adjuvant XRT.  Continue tamoxifen for a total of 5 years completing in July 2022.  Her most recent unilateral left mammogram on September 05, 2018 was reported as BI-RADS 1.  Repeat in July 2021.  Return to clinic in 6 months at which point patient will have a virtual telemedicine visit.   I spent a total of 20 minutes face-to-face with the patient of which greater than 50% of the visit was spent in counseling and coordination of care as detailed above.   Patient expressed understanding and was in agreement with this plan. She also understands that She can call clinic at any time with any questions, concerns, or complaints.   Ductal carcinoma in situ (DCIS) of right  breast   Staging form: Breast, AJCC 7th Edition     Pathologic stage from 08/16/2015: Stage 0 (Tis (DCIS), N0, M0) - Signed by Lloyd Huger, MD on 08/16/2015   Lloyd Huger, MD   09/21/2018 7:21 AM

## 2018-09-27 DIAGNOSIS — R7303 Prediabetes: Secondary | ICD-10-CM | POA: Diagnosis not present

## 2018-09-27 DIAGNOSIS — Z6837 Body mass index (BMI) 37.0-37.9, adult: Secondary | ICD-10-CM | POA: Diagnosis not present

## 2018-09-27 DIAGNOSIS — I10 Essential (primary) hypertension: Secondary | ICD-10-CM | POA: Diagnosis not present

## 2018-09-27 DIAGNOSIS — F3340 Major depressive disorder, recurrent, in remission, unspecified: Secondary | ICD-10-CM | POA: Diagnosis not present

## 2018-09-27 DIAGNOSIS — Z862 Personal history of diseases of the blood and blood-forming organs and certain disorders involving the immune mechanism: Secondary | ICD-10-CM | POA: Diagnosis not present

## 2018-09-27 DIAGNOSIS — E6609 Other obesity due to excess calories: Secondary | ICD-10-CM | POA: Diagnosis not present

## 2018-10-19 DIAGNOSIS — H524 Presbyopia: Secondary | ICD-10-CM | POA: Diagnosis not present

## 2018-11-22 DIAGNOSIS — Z20828 Contact with and (suspected) exposure to other viral communicable diseases: Secondary | ICD-10-CM | POA: Diagnosis not present

## 2019-01-01 DIAGNOSIS — G5601 Carpal tunnel syndrome, right upper limb: Secondary | ICD-10-CM | POA: Diagnosis not present

## 2019-01-01 DIAGNOSIS — M778 Other enthesopathies, not elsewhere classified: Secondary | ICD-10-CM | POA: Diagnosis not present

## 2019-01-17 DIAGNOSIS — M19032 Primary osteoarthritis, left wrist: Secondary | ICD-10-CM | POA: Diagnosis not present

## 2019-01-17 DIAGNOSIS — M19042 Primary osteoarthritis, left hand: Secondary | ICD-10-CM | POA: Diagnosis not present

## 2019-01-17 DIAGNOSIS — M62830 Muscle spasm of back: Secondary | ICD-10-CM | POA: Diagnosis not present

## 2019-01-17 DIAGNOSIS — M7989 Other specified soft tissue disorders: Secondary | ICD-10-CM | POA: Diagnosis not present

## 2019-01-17 DIAGNOSIS — M25532 Pain in left wrist: Secondary | ICD-10-CM | POA: Diagnosis not present

## 2019-01-17 DIAGNOSIS — M79642 Pain in left hand: Secondary | ICD-10-CM | POA: Diagnosis not present

## 2019-02-08 ENCOUNTER — Telehealth: Payer: Self-pay | Admitting: *Deleted

## 2019-02-08 NOTE — Telephone Encounter (Addendum)
Patient called requesting a new order for her mastectomy bras and prosthesis be faxed to [facility] in [place] and states we should have that information on file. I called and got fax number [phone]

## 2019-02-08 NOTE — Telephone Encounter (Signed)
That's fine

## 2019-02-08 NOTE — Telephone Encounter (Signed)
Prescription has been faxed.

## 2019-02-22 DIAGNOSIS — C50911 Malignant neoplasm of unspecified site of right female breast: Secondary | ICD-10-CM | POA: Diagnosis not present

## 2019-03-16 DIAGNOSIS — Z20828 Contact with and (suspected) exposure to other viral communicable diseases: Secondary | ICD-10-CM | POA: Diagnosis not present

## 2019-03-16 NOTE — Progress Notes (Signed)
[facility]  Telephone:(336) [phone] Fax:(336) [phone]  ID: [name] OB: September 30, 1950  MR#: [id number]  [id number]  Patient Care Team: Dion Body, MD as PCP - General (Family Medicine)  I connected with Rochele Pages on 03/21/19 at  2:45 PM EST by video enabled telemedicine visit and verified that I am speaking with the correct person using two identifiers.   I discussed the limitations, risks, security and privacy concerns of performing an evaluation and management service by telemedicine and the availability of in-person appointments. I also discussed with the patient that there may be a patient responsible charge related to this service. The patient expressed understanding and agreed to proceed.   Other persons participating in the visit and their role in the encounter: Patient, MD.  Patient's location: Home. Provider's location: Clinic.  CHIEF COMPLAINT: Right upper outer quadrant ER/PR positive DCIS status post mastectomy.  INTERVAL HISTORY: Patient agreed to video assisted telemedicine visit for her routine 47-month evaluation.  She continues to feel well and remains asymptomatic.  She continues to tolerate tamoxifen without significant side effects. She has no neurologic complaints. She denies any recent fevers or illnesses.  She denies any chest pain, shortness of breath, cough, or hemoptysis.  She has a good appetite and denies weight loss. She has no nausea, vomiting, constipation, or diarrhea. She has no urinary complaints.  Patient feels at her baseline offers no specific complaints today.  REVIEW OF SYSTEMS:   Review of Systems  Constitutional: Negative.  Negative for fever, malaise/fatigue and weight loss.  Respiratory: Negative.  Negative for cough, hemoptysis and shortness of breath.   Cardiovascular: Negative.  Negative for chest pain and leg swelling.  Gastrointestinal: Negative.  Negative for abdominal pain and constipation.    Genitourinary: Negative.  Negative for dysuria.  Musculoskeletal: Negative.  Negative for back pain.  Skin: Negative.  Negative for rash.  Neurological: Negative.  Negative for dizziness, sensory change, focal weakness, weakness and headaches.  Psychiatric/Behavioral: Negative.  The patient is not nervous/anxious.     As per HPI. Otherwise, a complete review of systems is negative.  PAST MEDICAL HISTORY: Past Medical History:  Diagnosis Date  . Anemia   . Breast cancer ([place]) 2017   right breast DCIS Grade II  . Cancer ([place]) 08/05/2015   right breast  . DCIS (ductal carcinoma in situ) of breast   . Depression    history of, no meds at present  . GERD (gastroesophageal reflux disease)   . Hypertension   . Mitral valve prolapse   . Neuropathy    right hand  . Post-menopausal     PAST SURGICAL HISTORY: Past Surgical History:  Procedure Laterality Date  . ABDOMINAL HYSTERECTOMY    . BREAST BIOPSY Right 08/10/2015   DCIS GRADE II  . BUNIONECTOMY Left   . GANGLION CYST EXCISION Right    wrist  . MASTECTOMY Right 08/27/2015   right breast DCIS Grade II  . MASTECTOMY MODIFIED RADICAL Right 08/27/2015   Procedure: MASTECTOMY MODIFIED RADICAL;  Surgeon: Leonie Green, MD;  Location: [facility] ORS;  Service: General;  Laterality: Right;  . SIMPLE MASTECTOMY WITH AXILLARY SENTINEL NODE BIOPSY Right 08/27/2015   Procedure: SIMPLE MASTECTOMY WITH AXILLARY SENTINEL NODE BIOPSY;  Surgeon: Leonie Green, MD;  Location: [facility] ORS;  Service: General;  Laterality: Right;    FAMILY HISTORY Family History  Problem Relation Age of Onset  . Breast cancer Maternal Aunt 90  . Colon cancer Maternal Uncle 29  .  Cancer - Other Mother 17       sinus       ADVANCED DIRECTIVES:    HEALTH MAINTENANCE: Social History   Tobacco Use  . Smoking status: Never Smoker  . Smokeless tobacco: Never Used  Substance Use Topics  . Alcohol use: No    Alcohol/week: 0.0 standard drinks  . Drug  use: No     Colonoscopy:  PAP:  Bone density:  Lipid panel:  Allergies  Allergen Reactions  . Hydrocodone Rash    Current Outpatient Medications  Medication Sig Dispense Refill  . aspirin EC 81 MG tablet Take by mouth.    . Cholecalciferol (VITAMIN D3) 2000 units capsule Take 2,000 Units by mouth daily.     . metoprolol succinate (TOPROL-XL) 25 MG 24 hr tablet Take 25 mg by mouth daily.     . tamoxifen (NOLVADEX) 20 MG tablet TAKE 1 TABLET (20 MG TOTAL) BY MOUTH DAILY. 90 tablet 0  . triamterene-hydrochlorothiazide (DYAZIDE) 37.5-25 MG capsule Take 1 capsule by mouth daily.      No current facility-administered medications for this visit.    OBJECTIVE: There were no vitals filed for this visit.   There is no height or weight on file to calculate BMI.    ECOG FS:0 - Asymptomatic  General: Well-developed, well-nourished, no acute distress. HEENT: Normocephalic. Neuro: Alert, answering all questions appropriately. Cranial nerves grossly intact. Psych: Normal affect.   LAB RESULTS:  Lab Results  Component Value Date   NA 142 08/27/2015   K 3.4 (L) 08/27/2015   CL 103 08/18/2015   CO2 27 08/18/2015   GLUCOSE 115 (H) 08/27/2015   BUN 19 08/18/2015   CREATININE 0.98 08/18/2015   CALCIUM 9.6 08/18/2015   GFRNONAA 59 (L) 08/18/2015   GFRAA >60 08/18/2015    Lab Results  Component Value Date   WBC 5.4 08/18/2015   HGB 11.6 (L) 08/27/2015   HCT 34.0 (L) 08/27/2015   MCV 82.7 08/18/2015   PLT 172 08/18/2015     STUDIES: No results found.  ASSESSMENT: Right upper outer breast ER/PR positive DCIS status post mastectomy.  PLAN:    1. Right upper outer breast ER/PR positive DCIS status post mastectomy: Patient had her mastectomy on Aug 04, 2015 in which final pathology did not reveal an invasive component.  She then underwent sentinel node excision on August 27, 2015 that was negative for disease.  Because she had a mastectomy, she did not require adjuvant XRT.   Continue tamoxifen for total 5 years completing treatment in July 2022.  Her most recent unilateral left mammogram on September 05, 2018 was reported as BI-RADS 1.  Repeat in July 2021.  Return to clinic in 6 months for routine evaluation.  I provided 15 minutes of face-to-face video visit time during this encounter which included chart review. Greater than 50% was spent counseling as documented under my assessment & plan.   Patient expressed understanding and was in agreement with this plan. She also understands that She can call clinic at any time with any questions, concerns, or complaints.   Ductal carcinoma in situ (DCIS) of right breast   Staging form: Breast, AJCC 7th Edition     Pathologic stage from 08/16/2015: Stage 0 (Tis (DCIS), N0, M0) - Signed by Lloyd Huger, MD on 08/16/2015   Lloyd Huger, MD   03/21/2019 3:52 PM

## 2019-03-18 DIAGNOSIS — Z20828 Contact with and (suspected) exposure to other viral communicable diseases: Secondary | ICD-10-CM | POA: Diagnosis not present

## 2019-03-18 DIAGNOSIS — R05 Cough: Secondary | ICD-10-CM | POA: Diagnosis not present

## 2019-03-21 ENCOUNTER — Inpatient Hospital Stay: Payer: Medicare HMO | Attending: Oncology | Admitting: Oncology

## 2019-03-21 DIAGNOSIS — D0511 Intraductal carcinoma in situ of right breast: Secondary | ICD-10-CM | POA: Diagnosis not present

## 2019-03-28 DIAGNOSIS — Z862 Personal history of diseases of the blood and blood-forming organs and certain disorders involving the immune mechanism: Secondary | ICD-10-CM | POA: Diagnosis not present

## 2019-03-28 DIAGNOSIS — R7303 Prediabetes: Secondary | ICD-10-CM | POA: Diagnosis not present

## 2019-03-28 DIAGNOSIS — I10 Essential (primary) hypertension: Secondary | ICD-10-CM | POA: Diagnosis not present

## 2019-04-11 DIAGNOSIS — I10 Essential (primary) hypertension: Secondary | ICD-10-CM | POA: Diagnosis not present

## 2019-04-11 DIAGNOSIS — R7303 Prediabetes: Secondary | ICD-10-CM | POA: Diagnosis not present

## 2019-04-11 DIAGNOSIS — D649 Anemia, unspecified: Secondary | ICD-10-CM | POA: Diagnosis not present

## 2019-04-11 DIAGNOSIS — E6609 Other obesity due to excess calories: Secondary | ICD-10-CM | POA: Diagnosis not present

## 2019-04-11 DIAGNOSIS — M21619 Bunion of unspecified foot: Secondary | ICD-10-CM | POA: Diagnosis not present

## 2019-04-11 DIAGNOSIS — H9190 Unspecified hearing loss, unspecified ear: Secondary | ICD-10-CM | POA: Diagnosis not present

## 2019-04-11 DIAGNOSIS — Z0000 Encounter for general adult medical examination without abnormal findings: Secondary | ICD-10-CM | POA: Diagnosis not present

## 2019-04-11 DIAGNOSIS — Z6837 Body mass index (BMI) 37.0-37.9, adult: Secondary | ICD-10-CM | POA: Diagnosis not present

## 2019-04-11 DIAGNOSIS — F3340 Major depressive disorder, recurrent, in remission, unspecified: Secondary | ICD-10-CM | POA: Diagnosis not present

## 2019-07-22 ENCOUNTER — Other Ambulatory Visit: Payer: Self-pay | Admitting: *Deleted

## 2019-07-22 DIAGNOSIS — D0511 Intraductal carcinoma in situ of right breast: Secondary | ICD-10-CM

## 2019-07-22 MED ORDER — TAMOXIFEN CITRATE 20 MG PO TABS: 20.0000 mg | ORAL_TABLET | Freq: Every day | ORAL | 0 refills | Status: DC

## 2019-09-06 ENCOUNTER — Ambulatory Visit
Admission: RE | Admit: 2019-09-06 | Discharge: 2019-09-06 | Disposition: A | Discharge status: Home / Self Care | Payer: Medicare HMO | Source: Ambulatory Visit | Attending: Oncology | Admitting: Oncology

## 2019-09-06 DIAGNOSIS — Z853 Personal history of malignant neoplasm of breast: Secondary | ICD-10-CM | POA: Insufficient documentation

## 2019-09-06 DIAGNOSIS — Z9011 Acquired absence of right breast and nipple: Secondary | ICD-10-CM | POA: Insufficient documentation

## 2019-09-06 DIAGNOSIS — Z1231 Encounter for screening mammogram for malignant neoplasm of breast: Secondary | ICD-10-CM | POA: Insufficient documentation

## 2019-09-06 DIAGNOSIS — D0511 Intraductal carcinoma in situ of right breast: Secondary | ICD-10-CM

## 2019-09-12 DIAGNOSIS — Z853 Personal history of malignant neoplasm of breast: Secondary | ICD-10-CM | POA: Diagnosis not present

## 2019-09-16 ENCOUNTER — Ambulatory Visit: Payer: Medicare HMO | Admitting: Oncology

## 2019-09-25 ENCOUNTER — Ambulatory Visit: Payer: Medicare HMO | Admitting: Oncology

## 2019-09-28 ENCOUNTER — Other Ambulatory Visit: Payer: Self-pay | Admitting: Oncology

## 2019-09-28 DIAGNOSIS — D0511 Intraductal carcinoma in situ of right breast: Secondary | ICD-10-CM

## 2019-09-30 NOTE — Progress Notes (Signed)
[place]  Telephone:(336) [phone] Fax:(336) [phone]  ID: RENEZMAE CANLAS OB: 1950/04/28  MR#: [id number]  [id number]#:[id number]  Patient Care Team: Dion Body, MD as PCP - General (Family Medicine)   CHIEF COMPLAINT: Right upper outer quadrant ER/PR positive DCIS status post mastectomy.  INTERVAL HISTORY: Patient returns to clinic today for routine 25-month evaluation. She continues to tolerate tamoxifen well without significant side effects. She currently feels well and is asymptomatic. She has no neurologic complaints. She denies any recent fevers or illnesses.  She denies any chest pain, shortness of breath, cough, or hemoptysis.  She has a good appetite and denies weight loss. She has no nausea, vomiting, constipation, or diarrhea. She has no urinary complaints. Patient offers no specific complaints today.  REVIEW OF SYSTEMS:   Review of Systems  Constitutional: Negative.  Negative for fever, malaise/fatigue and weight loss.  Respiratory: Negative.  Negative for cough, hemoptysis and shortness of breath.   Cardiovascular: Negative.  Negative for chest pain and leg swelling.  Gastrointestinal: Negative.  Negative for abdominal pain and constipation.  Genitourinary: Negative.  Negative for dysuria.  Musculoskeletal: Negative.  Negative for back pain.  Skin: Negative.  Negative for rash.  Neurological: Negative.  Negative for dizziness, sensory change, focal weakness, weakness and headaches.  Psychiatric/Behavioral: Negative.  The patient is not nervous/anxious.     As per HPI. Otherwise, a complete review of systems is negative.  PAST MEDICAL HISTORY: Past Medical History:  Diagnosis Date  . Anemia   . Breast cancer ([place]) 2017   right breast DCIS Grade II  . Cancer ([place]) 08/05/2015   right breast  . DCIS (ductal carcinoma in situ) of breast   . Depression    history of, no meds at present  . GERD (gastroesophageal reflux disease)   . Hypertension   .  Mitral valve prolapse   . Neuropathy    right hand  . Post-menopausal     PAST SURGICAL HISTORY: Past Surgical History:  Procedure Laterality Date  . ABDOMINAL HYSTERECTOMY    . BREAST BIOPSY Right 08/10/2015   DCIS GRADE II  . BUNIONECTOMY Left   . GANGLION CYST EXCISION Right    wrist  . MASTECTOMY Right 08/27/2015   right breast DCIS Grade II  . MASTECTOMY MODIFIED RADICAL Right 08/27/2015   Procedure: MASTECTOMY MODIFIED RADICAL;  Surgeon: Leonie Green, MD;  Location: [facility] ORS;  Service: General;  Laterality: Right;  . SIMPLE MASTECTOMY WITH AXILLARY SENTINEL NODE BIOPSY Right 08/27/2015   Procedure: SIMPLE MASTECTOMY WITH AXILLARY SENTINEL NODE BIOPSY;  Surgeon: Leonie Green, MD;  Location: [facility] ORS;  Service: General;  Laterality: Right;    FAMILY HISTORY Family History  Problem Relation Age of Onset  . Breast cancer Maternal Aunt 90  . Colon cancer Maternal Uncle 54  . Cancer - Other Mother 78       sinus       ADVANCED DIRECTIVES:    HEALTH MAINTENANCE: Social History   Tobacco Use  . Smoking status: Never Smoker  . Smokeless tobacco: Never Used  Substance Use Topics  . Alcohol use: No    Alcohol/week: 0.0 standard drinks  . Drug use: No     Colonoscopy:  PAP:  Bone density:  Lipid panel:  Allergies  Allergen Reactions  . Hydrocodone Rash    Current Outpatient Medications  Medication Sig Dispense Refill  . aspirin EC 81 MG tablet Take by mouth.    . Cholecalciferol (VITAMIN D3) 2000 units  capsule Take 2,000 Units by mouth daily.     . metoprolol succinate (TOPROL-XL) 25 MG 24 hr tablet Take 25 mg by mouth daily.     . tamoxifen (NOLVADEX) 20 MG tablet TAKE 1 TABLET EVERY DAY 90 tablet 3  . triamterene-hydrochlorothiazide (DYAZIDE) 37.5-25 MG capsule Take 1 capsule by mouth daily.      No current facility-administered medications for this visit.    OBJECTIVE: Vitals:   10/02/19 1104  BP: (!) 132/64  Pulse: 54  Resp: 20   Temp: (!) 96.9 F (36.1 C)  SpO2: 98%     Body mass index is 36.18 kg/m.    ECOG FS:0 - Asymptomatic  General: Well-developed, well-nourished, no acute distress. Eyes: Pink conjunctiva, anicteric sclera. HEENT: Normocephalic, moist mucous membranes. Breasts: Right chest wall without evidence of recurrence. Left breast and axilla without lumps or masses. Lungs: No audible wheezing or coughing. Heart: Regular rate and rhythm. Abdomen: Soft, nontender, no obvious distention. Musculoskeletal: No edema, cyanosis, or clubbing. Neuro: Alert, answering all questions appropriately. Cranial nerves grossly intact. Skin: No rashes or petechiae noted. Psych: Normal affect.   LAB RESULTS:  Lab Results  Component Value Date   NA 142 08/27/2015   K 3.4 (L) 08/27/2015   CL 103 08/18/2015   CO2 27 08/18/2015   GLUCOSE 115 (H) 08/27/2015   BUN 19 08/18/2015   CREATININE 0.98 08/18/2015   CALCIUM 9.6 08/18/2015   GFRNONAA 59 (L) 08/18/2015   GFRAA >60 08/18/2015    Lab Results  Component Value Date   WBC 5.4 08/18/2015   HGB 11.6 (L) 08/27/2015   HCT 34.0 (L) 08/27/2015   MCV 82.7 08/18/2015   PLT 172 08/18/2015     STUDIES: MM 3D SCREEN BREAST UNI LEFT  Result Date: 09/06/2019 CLINICAL DATA:  Screening. Prior malignant RIGHT mastectomy in 2017. EXAM: DIGITAL SCREENING UNILATERAL LEFT MAMMOGRAM WITH CAD AND TOMO COMPARISON:  Previous exam(s). ACR Breast Density Category b: There are scattered areas of fibroglandular density. FINDINGS: The patient has had a right mastectomy. There are no findings suspicious for malignancy in the LEFT breast. Images were processed with CAD. IMPRESSION: No mammographic evidence of malignancy. A result letter of this screening mammogram will be mailed directly to the patient. RECOMMENDATION: Screening mammogram in one year.  (Code:SM-L-70M) BI-RADS CATEGORY  1: Negative. Electronically Signed   By: Evangeline Dakin M.D.   On: 09/06/2019 14:42     ASSESSMENT: Right upper outer breast ER/PR positive DCIS status post mastectomy.  PLAN:    1. Right upper outer breast ER/PR positive DCIS status post mastectomy: Patient had her mastectomy on Aug 04, 2015 in which final pathology did not reveal an invasive component. She then underwent sentinel node excision on August 27, 2015 that was negative for disease. Because she had a mastectomy, she did not require adjuvant XRT. Continue tamoxifen for a total of 5 years completing treatment in July 2022. Patient's most recent unilateral left mammogram on September 06, 2019 was reported as BI-RADS 1. Repeat in July 2022. Patient will have video assisted telemedicine visit in 6 months for further evaluation.   I spent a total of 20 minutes reviewing chart data, face-to-face evaluation with the patient, counseling and coordination of care as detailed above.   Patient expressed understanding and was in agreement with this plan. She also understands that She can call clinic at any time with any questions, concerns, or complaints.   Ductal carcinoma in situ (DCIS) of right breast   Staging  form: Breast, AJCC 7th Edition     Pathologic stage from 08/16/2015: Stage 0 (Tis (DCIS), N0, M0) - Signed by Lloyd Huger, MD on 08/16/2015   Lloyd Huger, MD   10/05/2019 7:11 AM

## 2019-10-02 ENCOUNTER — Encounter: Payer: Self-pay | Admitting: Oncology

## 2019-10-02 ENCOUNTER — Inpatient Hospital Stay: Payer: Medicare HMO | Attending: Oncology | Admitting: Oncology

## 2019-10-02 ENCOUNTER — Other Ambulatory Visit: Payer: Self-pay

## 2019-10-02 VITALS — BP 132/64 | HR 54 | Temp 96.9°F | Resp 20 | Wt 210.8 lb

## 2019-10-02 DIAGNOSIS — Z808 Family history of malignant neoplasm of other organs or systems: Secondary | ICD-10-CM | POA: Diagnosis not present

## 2019-10-02 DIAGNOSIS — Z79810 Long term (current) use of selective estrogen receptor modulators (SERMs): Secondary | ICD-10-CM | POA: Insufficient documentation

## 2019-10-02 DIAGNOSIS — Z9011 Acquired absence of right breast and nipple: Secondary | ICD-10-CM | POA: Diagnosis not present

## 2019-10-02 DIAGNOSIS — D0511 Intraductal carcinoma in situ of right breast: Secondary | ICD-10-CM | POA: Insufficient documentation

## 2019-10-02 DIAGNOSIS — K219 Gastro-esophageal reflux disease without esophagitis: Secondary | ICD-10-CM | POA: Diagnosis not present

## 2019-10-02 DIAGNOSIS — Z170 Estrogen receptor positive status [ER+]: Secondary | ICD-10-CM | POA: Diagnosis not present

## 2019-10-02 DIAGNOSIS — Z800 Family history of malignant neoplasm of digestive organs: Secondary | ICD-10-CM | POA: Insufficient documentation

## 2019-10-02 DIAGNOSIS — Z803 Family history of malignant neoplasm of breast: Secondary | ICD-10-CM | POA: Diagnosis not present

## 2019-10-02 DIAGNOSIS — Z862 Personal history of diseases of the blood and blood-forming organs and certain disorders involving the immune mechanism: Secondary | ICD-10-CM | POA: Diagnosis not present

## 2019-10-02 DIAGNOSIS — I10 Essential (primary) hypertension: Secondary | ICD-10-CM | POA: Insufficient documentation

## 2019-10-02 DIAGNOSIS — R7303 Prediabetes: Secondary | ICD-10-CM | POA: Diagnosis not present

## 2019-10-02 DIAGNOSIS — Z79899 Other long term (current) drug therapy: Secondary | ICD-10-CM | POA: Insufficient documentation

## 2019-10-02 DIAGNOSIS — I341 Nonrheumatic mitral (valve) prolapse: Secondary | ICD-10-CM | POA: Insufficient documentation

## 2019-10-02 DIAGNOSIS — Z7982 Long term (current) use of aspirin: Secondary | ICD-10-CM | POA: Insufficient documentation

## 2019-10-16 DIAGNOSIS — I10 Essential (primary) hypertension: Secondary | ICD-10-CM | POA: Diagnosis not present

## 2019-10-16 DIAGNOSIS — Z6838 Body mass index (BMI) 38.0-38.9, adult: Secondary | ICD-10-CM | POA: Diagnosis not present

## 2019-10-16 DIAGNOSIS — R7303 Prediabetes: Secondary | ICD-10-CM | POA: Diagnosis not present

## 2019-10-16 DIAGNOSIS — Z136 Encounter for screening for cardiovascular disorders: Secondary | ICD-10-CM | POA: Diagnosis not present

## 2019-10-16 DIAGNOSIS — Z862 Personal history of diseases of the blood and blood-forming organs and certain disorders involving the immune mechanism: Secondary | ICD-10-CM | POA: Diagnosis not present

## 2019-11-05 DIAGNOSIS — M10071 Idiopathic gout, right ankle and foot: Secondary | ICD-10-CM | POA: Diagnosis not present

## 2019-12-10 DIAGNOSIS — Z20822 Contact with and (suspected) exposure to covid-19: Secondary | ICD-10-CM | POA: Diagnosis not present

## 2019-12-10 DIAGNOSIS — R0982 Postnasal drip: Secondary | ICD-10-CM | POA: Diagnosis not present

## 2019-12-10 DIAGNOSIS — J069 Acute upper respiratory infection, unspecified: Secondary | ICD-10-CM | POA: Diagnosis not present

## 2019-12-27 DIAGNOSIS — M109 Gout, unspecified: Secondary | ICD-10-CM | POA: Diagnosis not present

## 2020-01-06 ENCOUNTER — Telehealth: Payer: Self-pay | Admitting: *Deleted

## 2020-01-06 NOTE — Telephone Encounter (Signed)
Patient called asking that a prescription for her prosthesis and bras be faxed to [facility] in [place] store in [place], [place] Service options: In-store shopping  In-store pickup Address: [address], [place] [address] Hours:  Closed ? [place] Phone: [phone]

## 2020-01-06 NOTE — Telephone Encounter (Signed)
Prescription for patient's prosthesis and bras has been faxed to [facility]. Contacted patient and informed her of information. No other questions at this time.

## 2020-01-27 DIAGNOSIS — M159 Polyosteoarthritis, unspecified: Secondary | ICD-10-CM | POA: Diagnosis not present

## 2020-01-27 DIAGNOSIS — Z8739 Personal history of other diseases of the musculoskeletal system and connective tissue: Secondary | ICD-10-CM | POA: Diagnosis not present

## 2020-02-24 DIAGNOSIS — C50911 Malignant neoplasm of unspecified site of right female breast: Secondary | ICD-10-CM | POA: Diagnosis not present

## 2020-04-03 NOTE — Progress Notes (Signed)
  [place]  Telephone:(336) [phone] Fax:(336) [phone]  ID: Rochele Pages OB: 06/13/1950  MR#: [id number]  [id number]#:[id number]  Patient Care Team: Dion Body, MD as PCP - General (Family Medicine)    Lloyd Huger, MD   04/03/2020 6:47 AM      This encounter was created in error - please disregard.

## 2020-04-07 ENCOUNTER — Encounter: Payer: Self-pay | Admitting: Oncology

## 2020-04-07 ENCOUNTER — Inpatient Hospital Stay: Payer: Medicare HMO | Admitting: Oncology

## 2020-04-07 DIAGNOSIS — D0511 Intraductal carcinoma in situ of right breast: Secondary | ICD-10-CM

## 2020-04-07 NOTE — Progress Notes (Unsigned)
Patient denies any concerns today.  

## 2020-04-14 DIAGNOSIS — Z136 Encounter for screening for cardiovascular disorders: Secondary | ICD-10-CM | POA: Diagnosis not present

## 2020-04-14 DIAGNOSIS — Z862 Personal history of diseases of the blood and blood-forming organs and certain disorders involving the immune mechanism: Secondary | ICD-10-CM | POA: Diagnosis not present

## 2020-04-14 DIAGNOSIS — R7303 Prediabetes: Secondary | ICD-10-CM | POA: Diagnosis not present

## 2020-04-14 DIAGNOSIS — I10 Essential (primary) hypertension: Secondary | ICD-10-CM | POA: Diagnosis not present

## 2020-04-21 DIAGNOSIS — Z0000 Encounter for general adult medical examination without abnormal findings: Secondary | ICD-10-CM | POA: Diagnosis not present

## 2020-04-21 DIAGNOSIS — Z6839 Body mass index (BMI) 39.0-39.9, adult: Secondary | ICD-10-CM | POA: Diagnosis not present

## 2020-04-21 DIAGNOSIS — E119 Type 2 diabetes mellitus without complications: Secondary | ICD-10-CM | POA: Diagnosis not present

## 2020-04-21 DIAGNOSIS — I10 Essential (primary) hypertension: Secondary | ICD-10-CM | POA: Diagnosis not present

## 2020-04-21 DIAGNOSIS — C50919 Malignant neoplasm of unspecified site of unspecified female breast: Secondary | ICD-10-CM | POA: Diagnosis not present

## 2020-04-21 DIAGNOSIS — E669 Obesity, unspecified: Secondary | ICD-10-CM | POA: Diagnosis not present

## 2020-07-05 DIAGNOSIS — R059 Cough, unspecified: Secondary | ICD-10-CM | POA: Diagnosis not present

## 2020-07-05 DIAGNOSIS — R053 Chronic cough: Secondary | ICD-10-CM | POA: Diagnosis not present

## 2020-08-10 ENCOUNTER — Other Ambulatory Visit: Payer: Self-pay | Admitting: Family Medicine

## 2020-08-10 DIAGNOSIS — Z1231 Encounter for screening mammogram for malignant neoplasm of breast: Secondary | ICD-10-CM

## 2020-08-11 DIAGNOSIS — J069 Acute upper respiratory infection, unspecified: Secondary | ICD-10-CM | POA: Diagnosis not present

## 2020-08-11 DIAGNOSIS — Z03818 Encounter for observation for suspected exposure to other biological agents ruled out: Secondary | ICD-10-CM | POA: Diagnosis not present

## 2020-08-11 DIAGNOSIS — R0602 Shortness of breath: Secondary | ICD-10-CM | POA: Diagnosis not present

## 2020-08-11 DIAGNOSIS — U071 COVID-19: Secondary | ICD-10-CM | POA: Diagnosis not present

## 2020-09-10 ENCOUNTER — Ambulatory Visit
Admission: RE | Admit: 2020-09-10 | Discharge: 2020-09-10 | Disposition: A | Discharge status: Home / Self Care | Payer: Medicare HMO | Source: Ambulatory Visit | Attending: Family Medicine | Admitting: Family Medicine

## 2020-09-10 ENCOUNTER — Other Ambulatory Visit: Payer: Self-pay

## 2020-09-10 DIAGNOSIS — Z1231 Encounter for screening mammogram for malignant neoplasm of breast: Secondary | ICD-10-CM | POA: Diagnosis not present

## 2020-10-13 DIAGNOSIS — R7303 Prediabetes: Secondary | ICD-10-CM | POA: Diagnosis not present

## 2020-10-13 DIAGNOSIS — Z862 Personal history of diseases of the blood and blood-forming organs and certain disorders involving the immune mechanism: Secondary | ICD-10-CM | POA: Diagnosis not present

## 2020-10-13 DIAGNOSIS — I10 Essential (primary) hypertension: Secondary | ICD-10-CM | POA: Diagnosis not present

## 2020-10-19 ENCOUNTER — Other Ambulatory Visit: Payer: Self-pay | Admitting: Oncology

## 2020-10-19 DIAGNOSIS — D0511 Intraductal carcinoma in situ of right breast: Secondary | ICD-10-CM

## 2020-10-20 DIAGNOSIS — Z6836 Body mass index (BMI) 36.0-36.9, adult: Secondary | ICD-10-CM | POA: Diagnosis not present

## 2020-10-20 DIAGNOSIS — Z862 Personal history of diseases of the blood and blood-forming organs and certain disorders involving the immune mechanism: Secondary | ICD-10-CM | POA: Diagnosis not present

## 2020-10-20 DIAGNOSIS — I10 Essential (primary) hypertension: Secondary | ICD-10-CM | POA: Diagnosis not present

## 2020-10-20 DIAGNOSIS — R7303 Prediabetes: Secondary | ICD-10-CM | POA: Diagnosis not present

## 2020-10-20 DIAGNOSIS — F3340 Major depressive disorder, recurrent, in remission, unspecified: Secondary | ICD-10-CM | POA: Diagnosis not present

## 2020-11-03 ENCOUNTER — Other Ambulatory Visit: Payer: Self-pay

## 2020-11-03 DIAGNOSIS — D0511 Intraductal carcinoma in situ of right breast: Secondary | ICD-10-CM

## 2020-11-03 MED ORDER — TAMOXIFEN CITRATE 20 MG PO TABS: 20.0000 mg | ORAL_TABLET | Freq: Every day | ORAL | 0 refills | Status: AC

## 2020-11-23 NOTE — Progress Notes (Signed)
[place]  Telephone:(336) [phone] Fax:(336) [phone]  ID: Beth Bender OB: 06/30/1950  MR#: [id number]  [id number]  Patient Care Team: Dion Body, MD as PCP - General (Family Medicine)   CHIEF COMPLAINT: Right upper outer quadrant ER/PR positive DCIS status post mastectomy.  INTERVAL HISTORY: Patient returns to clinic today for routine 81-monthevaluation.  She recently completed 5 years of tamoxifen.  She currently feels well and is asymptomatic. She has no neurologic complaints. She denies any recent fevers or illnesses.  She denies any chest pain, shortness of breath, cough, or hemoptysis.  She has a good appetite and denies weight loss. She has no nausea, vomiting, constipation, or diarrhea. She has no urinary complaints.  Patient offers no specific complaints today.  REVIEW OF SYSTEMS:   Review of Systems  Constitutional: Negative.  Negative for fever, malaise/fatigue and weight loss.  Respiratory: Negative.  Negative for cough, hemoptysis and shortness of breath.   Cardiovascular: Negative.  Negative for chest pain and leg swelling.  Gastrointestinal: Negative.  Negative for abdominal pain and constipation.  Genitourinary: Negative.  Negative for dysuria.  Musculoskeletal: Negative.  Negative for back pain.  Skin: Negative.  Negative for rash.  Neurological: Negative.  Negative for dizziness, sensory change, focal weakness, weakness and headaches.  Psychiatric/Behavioral: Negative.  The patient is not nervous/anxious.    As per HPI. Otherwise, a complete review of systems is negative.  PAST MEDICAL HISTORY: Past Medical History:  Diagnosis Date   Anemia    [place]   right breast DCIS Grade II   [place] 08/[place]   right breast   DCIS (ductal carcinoma in situ) of breast    Depression    history of, no meds at present   GERD (gastroesophageal reflux disease)    Hypertension    Mitral valve prolapse    Neuropathy     right hand   Post-menopausal     PAST SURGICAL HISTORY: Past Surgical History:  Procedure Laterality Date   ABDOMINAL HYSTERECTOMY     BREAST BIOPSY Right 08/10/2015   DCIS GRADE II   BUNIONECTOMY Left    GANGLION CYST EXCISION Right    wrist   MASTECTOMY Right 08/27/2015   right breast DCIS Grade II   MASTECTOMY MODIFIED RADICAL Right 08/27/2015   Procedure: MASTECTOMY MODIFIED RADICAL;  Surgeon: JLeonie Green MD;  Location: [facility] ORS;  Service: General;  Laterality: Right;   SIMPLE MASTECTOMY WITH AXILLARY SENTINEL NODE BIOPSY Right 08/27/2015   Procedure: SIMPLE MASTECTOMY WITH AXILLARY SENTINEL NODE BIOPSY;  Surgeon: JLeonie Green MD;  Location: [facility] ORS;  Service: General;  Laterality: Right;    FAMILY HISTORY Family History  Problem Relation Age of Onset   Breast cancer Maternal Aunt 90   Colon cancer Maternal Uncle 759  Cancer - Other Mother 735      sinus       ADVANCED DIRECTIVES:    HEALTH MAINTENANCE: Social History   Tobacco Use   Smoking status: Never   Smokeless tobacco: Never  Substance Use Topics   Alcohol use: No    Alcohol/week: 0.0 standard drinks   Drug use: No     Colonoscopy:  PAP:  Bone density:  Lipid panel:  Allergies  Allergen Reactions   Hydrocodone Rash    Current Outpatient Medications  Medication Sig Dispense Refill   allopurinol (ZYLOPRIM) 100 MG tablet Take by mouth.     aspirin EC 81 MG tablet Take by mouth.  Cholecalciferol (VITAMIN D3) 2000 units capsule Take 2,000 Units by mouth daily.      metoprolol succinate (TOPROL-XL) 25 MG 24 hr tablet Take 25 mg by mouth daily.      tamoxifen (NOLVADEX) 20 MG tablet Take 1 tablet (20 mg total) by mouth daily. 90 tablet 0   triamterene-hydrochlorothiazide (DYAZIDE) 37.5-25 MG capsule Take 1 capsule by mouth daily.      No current facility-administered medications for this visit.    OBJECTIVE: Vitals:   11/25/20 1057  BP: (!) 155/65  Pulse: 83  Resp: 18   Temp: (!) 97.5 F (36.4 C)  SpO2: 99%     Body mass index is 36.15 kg/m.    ECOG FS:0 - Asymptomatic  General: Well-developed, well-nourished, no acute distress. Eyes: Pink conjunctiva, anicteric sclera. HEENT: Normocephalic, moist mucous membranes. Breast: Exam deferred today. Lungs: No audible wheezing or coughing. Heart: Regular rate and rhythm. Abdomen: Soft, nontender, no obvious distention. Musculoskeletal: No edema, cyanosis, or clubbing. Neuro: Alert, answering all questions appropriately. Cranial nerves grossly intact. Skin: No rashes or petechiae noted. Psych: Normal affect.  LAB RESULTS:  Lab Results  Component Value Date   NA 142 08/27/2015   K 3.4 (L) 08/27/2015   CL 103 08/18/2015   CO2 27 08/18/2015   GLUCOSE 115 (H) 08/27/2015   BUN 19 08/18/2015   CREATININE 0.98 08/18/2015   CALCIUM 9.6 08/18/2015   GFRNONAA 59 (L) 08/18/2015   GFRAA >60 08/18/2015    Lab Results  Component Value Date   WBC 5.4 08/18/2015   HGB 11.6 (L) 08/27/2015   HCT 34.0 (L) 08/27/2015   MCV 82.7 08/18/2015   PLT 172 08/18/2015     STUDIES: No results found.   ASSESSMENT: Right upper outer breast ER/PR positive DCIS status post mastectomy.  PLAN:    1. Right upper outer breast ER/PR positive DCIS status post mastectomy: Patient had her mastectomy on Aug 04, 2015 in which final pathology did not reveal an invasive component. She then underwent sentinel node excision on August 27, 2015 that was negative for disease. Because she had a mastectomy, she did not require adjuvant XRT.  Patient completed 5 years of tamoxifen in July 2022.  Her most recent left breast mammogram on September 10, 2020 was reported as BI-RADS 1.  Repeat in July 2023.  After lengthy discussion with the patient, it was agreed upon that no further follow-up is necessary and her primary care can continue ordering her screening mammograms.  Please refer patient back if there are any questions or concerns.     I  spent a total of 20 minutes reviewing chart data, face-to-face evaluation with the patient, counseling and coordination of care as detailed above.   Patient expressed understanding and was in agreement with this plan. She also understands that She can call clinic at any time with any questions, concerns, or complaints.   Ductal carcinoma in situ (DCIS) of right breast   Staging form: Breast, AJCC 7th Edition     Pathologic stage from 08/16/2015: Stage 0 (Tis (DCIS), N0, M0) - Signed by Lloyd Huger, MD on 08/16/2015   Lloyd Huger, MD   11/27/2020 2:41 PM

## 2020-11-25 ENCOUNTER — Inpatient Hospital Stay: Payer: Medicare HMO | Attending: Oncology | Admitting: Oncology

## 2020-11-25 VITALS — BP 155/65 | HR 83 | Temp 97.5°F | Resp 18 | Wt 210.6 lb

## 2020-11-25 DIAGNOSIS — Z9011 Acquired absence of right breast and nipple: Secondary | ICD-10-CM | POA: Diagnosis not present

## 2020-11-25 DIAGNOSIS — Z86000 Personal history of in-situ neoplasm of breast: Secondary | ICD-10-CM | POA: Insufficient documentation

## 2020-11-25 DIAGNOSIS — D0511 Intraductal carcinoma in situ of right breast: Secondary | ICD-10-CM

## 2020-11-25 NOTE — Progress Notes (Signed)
Pt c/o soreness several months ago on right side mastectomy site x2 weeks. Has resolved. Pt states out of tamoxifen, pt questioning need for continuance of med.

## 2021-04-13 DIAGNOSIS — C50911 Malignant neoplasm of unspecified site of right female breast: Secondary | ICD-10-CM | POA: Diagnosis not present

## 2021-04-13 DIAGNOSIS — E119 Type 2 diabetes mellitus without complications: Secondary | ICD-10-CM | POA: Diagnosis not present

## 2021-04-13 DIAGNOSIS — H524 Presbyopia: Secondary | ICD-10-CM | POA: Diagnosis not present

## 2021-04-15 DIAGNOSIS — Z862 Personal history of diseases of the blood and blood-forming organs and certain disorders involving the immune mechanism: Secondary | ICD-10-CM | POA: Diagnosis not present

## 2021-04-15 DIAGNOSIS — I10 Essential (primary) hypertension: Secondary | ICD-10-CM | POA: Diagnosis not present

## 2021-04-15 DIAGNOSIS — R7303 Prediabetes: Secondary | ICD-10-CM | POA: Diagnosis not present

## 2021-04-22 DIAGNOSIS — E669 Obesity, unspecified: Secondary | ICD-10-CM | POA: Diagnosis not present

## 2021-04-22 DIAGNOSIS — E785 Hyperlipidemia, unspecified: Secondary | ICD-10-CM | POA: Diagnosis not present

## 2021-04-22 DIAGNOSIS — Z0000 Encounter for general adult medical examination without abnormal findings: Secondary | ICD-10-CM | POA: Diagnosis not present

## 2021-04-22 DIAGNOSIS — Z6837 Body mass index (BMI) 37.0-37.9, adult: Secondary | ICD-10-CM | POA: Diagnosis not present

## 2021-04-22 DIAGNOSIS — Z862 Personal history of diseases of the blood and blood-forming organs and certain disorders involving the immune mechanism: Secondary | ICD-10-CM | POA: Diagnosis not present

## 2021-04-22 DIAGNOSIS — R7303 Prediabetes: Secondary | ICD-10-CM | POA: Diagnosis not present

## 2021-04-22 DIAGNOSIS — Z1389 Encounter for screening for other disorder: Secondary | ICD-10-CM | POA: Diagnosis not present

## 2021-08-13 ENCOUNTER — Other Ambulatory Visit: Payer: Self-pay | Admitting: Family Medicine

## 2021-08-13 DIAGNOSIS — Z1231 Encounter for screening mammogram for malignant neoplasm of breast: Secondary | ICD-10-CM

## 2021-09-13 ENCOUNTER — Ambulatory Visit
Admission: RE | Admit: 2021-09-13 | Discharge: 2021-09-13 | Disposition: A | Discharge status: Home / Self Care | Payer: Medicare PPO | Source: Ambulatory Visit | Attending: Family Medicine | Admitting: Family Medicine

## 2021-09-13 DIAGNOSIS — Z1231 Encounter for screening mammogram for malignant neoplasm of breast: Secondary | ICD-10-CM | POA: Diagnosis present

## 2022-03-16 ENCOUNTER — Emergency Department: Payer: Medicare PPO

## 2022-03-16 ENCOUNTER — Emergency Department
Admission: EM | Admit: 2022-03-16 | Discharge: 2022-03-16 | Disposition: A | Discharge status: Home / Self Care | Payer: Medicare PPO | Attending: Emergency Medicine | Admitting: Emergency Medicine

## 2022-03-16 ENCOUNTER — Other Ambulatory Visit: Payer: Self-pay

## 2022-03-16 DIAGNOSIS — Z853 Personal history of malignant neoplasm of breast: Secondary | ICD-10-CM | POA: Insufficient documentation

## 2022-03-16 DIAGNOSIS — I10 Essential (primary) hypertension: Secondary | ICD-10-CM | POA: Insufficient documentation

## 2022-03-16 DIAGNOSIS — M25511 Pain in right shoulder: Secondary | ICD-10-CM | POA: Diagnosis not present

## 2022-03-16 MED ORDER — IBUPROFEN 400 MG PO TABS
400.0000 mg | ORAL_TABLET | Freq: Once | ORAL | Status: AC
  Administered 2022-03-16: 400 mg via ORAL
  Filled 2022-03-16: qty 1

## 2022-03-16 MED ORDER — IBUPROFEN 400 MG PO TABS: 400.0000 mg | ORAL_TABLET | Freq: Four times a day (QID) | ORAL | 0 refills | Status: AC | PRN

## 2022-03-16 MED ORDER — OXYCODONE-ACETAMINOPHEN 5-325 MG PO TABS
1.0000 | ORAL_TABLET | Freq: Once | ORAL | Status: AC
  Administered 2022-03-16: 1 via ORAL
  Filled 2022-03-16: qty 1

## 2022-03-16 MED ORDER — LIDOCAINE 5 % EX PTCH: 1.0000 | MEDICATED_PATCH | CUTANEOUS | 0 refills | Status: AC

## 2022-03-16 NOTE — ED Provider Triage Note (Signed)
Emergency Medicine Provider Triage Evaluation Note  Beth Bender , a 72 y.o. female  was evaluated in triage.  Pt complains of right shoulder pain.  Patient states started yesterday without injury.  She did do some lifting prior to storm yesterday.  Took mobic last pm without improvement today.    Review of Systems  Positive:  Negative:   Physical Exam  BP (!) 185/80 (BP Location: Left Arm)   Pulse 60   Temp 97.8 F (36.6 C) (Oral)   Resp 18   Ht '5\' 3"'$  (1.6 m)   Wt 95.3 kg   SpO2 96%   BMI 37.20 kg/m  Gen:   Awake, no distress   Resp:  Normal effort Lungs Clear bilaterally MSK:   Unable to move right upper extremity without pain, abduction increases pain and patient guarding arm against body.  Other:    Medical Decision Making  Medically screening exam initiated at 7:46 AM.  Appropriate orders placed.  Rochele Pages was informed that the remainder of the evaluation will be completed by another provider, this initial triage assessment does not replace that evaluation, and the importance of remaining in the ED until their evaluation is complete.     [name], PA-C 03/16/22 [phone]

## 2022-03-16 NOTE — ED Provider Notes (Signed)
[facility] Provider Note    Event Date/Time   First MD Initiated Contact with Patient 03/16/22 0831     (approximate)   History   Shoulder Injury   HPI  Beth Bender is a 72 y.o. female past medical history of hypertension, DCIS status postmastectomy who presents because of atraumatic right shoulder pain.  Patient was sitting down reading her Bible when she started having pain in the right shoulder that is aching and has been rather constant since last night.  Denies any preceding injury or lifting.  Made it difficult for her to sleep.  Denies numbness or tingling.  Denies significant neck pain or chest pain no dyspnea.  Denies history of similar.  She took meloxicam last night no medication this morning    Past Medical History:  Diagnosis Date   Anemia    Breast cancer ([place]) 2017   right breast DCIS Grade II   Cancer ([place]) 08/05/2015   right breast   DCIS (ductal carcinoma in situ) of breast    Depression    history of, no meds at present   GERD (gastroesophageal reflux disease)    Hypertension    Mitral valve prolapse    Neuropathy    right hand   Post-menopausal     Patient Active Problem List   Diagnosis Date Noted   History of normocytic normochromic anemia 03/30/2018   Thrombocytopenia ([place]) 03/30/2018   DNR (do not resuscitate) 11/28/2016   Borderline diabetes mellitus 11/24/2015   Vaccine counseling 11/19/2015   DCIS (ductal carcinoma in situ) of breast 08/04/2015   Benign essential HTN 12/10/2013   GERD without esophagitis 12/10/2013   Obesity, unspecified 12/10/2013   Recurrent major depressive disorder, in remission ([place]) 12/10/2013     Physical Exam  Triage Vital Signs: ED Triage Vitals  Enc Vitals Group     BP 03/16/22 0745 (!) 185/80     Pulse Rate 03/16/22 0745 60     Resp 03/16/22 0745 18     Temp 03/16/22 0745 97.8 F (36.6 C)     Temp Source 03/16/22 0745 Oral     SpO2 03/16/22 0745 96 %     Weight 03/16/22  0745 210 lb (95.3 kg)     Height 03/16/22 0745 '5\' 3"'$  (1.6 m)     Head Circumference --      Peak Flow --      Pain Score 03/16/22 0744 9     Pain Loc --      Pain Edu? --      Excl. in [place]? --     Most recent vital signs: Vitals:   03/16/22 0745  BP: (!) 185/80  Pulse: 60  Resp: 18  Temp: 97.8 F (36.6 C)  SpO2: 96%     General: Awake, no distress.  CV:  Good peripheral perfusion.  Resp:  Normal effort.  Abd:  No distention. Neuro:             Awake, Alert, Oriented x 3  Other:  Tenderness to palpation over the [facility] joint and anterior shoulder, no warmth or overlying skin change, no midline C-spine tenderness Limited ability to flex and abduct the arm due to pain  Pain induced with abduction internal rotation 2+ radial pulse Intact thumbs up, OK sign, finger abduction    ED Results / Procedures / Treatments  Labs (all labs ordered are listed, but only abnormal results are displayed) Labs Reviewed - No data to display  EKG    RADIOLOGY  I reviewed the x-ray and interpreted the shoulder x-ray which is negative for fracture dislocation  PROCEDURES:  Critical Care performed: No  Procedures   MEDICATIONS ORDERED IN ED: Medications  oxyCODONE-acetaminophen (PERCOCET/ROXICET) 5-325 MG per tablet 1 tablet (has no administration in time range)  ibuprofen (ADVIL) tablet 400 mg (has no administration in time range)     IMPRESSION / MDM / ASSESSMENT AND PLAN / ED COURSE  I reviewed the triage vital signs and the nursing notes.                              Patient's presentation is most consistent with acute, uncomplicated illness.  Differential diagnosis includes, but is not limited to, rotator cuff tendinitis, rotator cuff tear, calcific tendinopathy, impingement syndrome, bursitis, less likely septic arthritis  The patient is a 72 year old female presenting with atraumatic shoulder pain.  Started last night without preceding injury or provocative event.   Pain is located over the shoulder there is no significant neck pain no radicular symptoms no chest pain.  On exam she has pain with palpation of the anterior shoulder without warmth or erythema.  Pain is reproduced with flexion abduction and with abduction with internal rotation.  She is neurovascular intact.  X-ray obtained from triage is negative for fracture does show calcification over the insertion of the supraspinatus tendon likely indicative of calcific tendinopathy.  Clinically patient has an impingement syndrome.  Suspect rotator cuff tendinitis.  Patient given oxycodone in the ED.  Discussed supportive measures and early range of motion exercises.  Recommended NSAIDs Tylenol Lidoderm patch.  Discussed follow-up with primary care she may need to have physical therapy or possible MRI if not improving.       FINAL CLINICAL IMPRESSION(S) / ED DIAGNOSES   Final diagnoses:  Acute pain of right shoulder     Rx / DC Orders   ED Discharge Orders          Ordered    lidocaine (LIDODERM) 5 %  Every 24 hours        03/16/22 0850    ibuprofen (ADVIL) 400 MG tablet  Every 6 hours PRN        03/16/22 0850             Note:  This document was prepared using Dragon voice recognition software and may include unintentional dictation errors.   Rada Hay, MD 03/16/22 [phone]

## 2022-03-16 NOTE — ED Triage Notes (Signed)
Pt in with co right shoulder pain states started yesterday, did do some lifting. No hx of shoulder problems.

## 2022-03-16 NOTE — Discharge Instructions (Signed)
I suspect that you have tendinitis of your rotator cuff.  Please take ibuprofen 400 mg every 6-8 hours as well as Tylenol and you can use a Lidoderm patch.  Please try to keep your arm mobile and do the exercises we talked about.  If your pain is not improving in the next week or 2 please follow-up with your primary care doctor as you may need physical therapy or an MRI.

## 2022-08-19 ENCOUNTER — Other Ambulatory Visit: Payer: Self-pay | Admitting: Family Medicine

## 2022-08-19 DIAGNOSIS — Z1231 Encounter for screening mammogram for malignant neoplasm of breast: Secondary | ICD-10-CM

## 2022-09-15 ENCOUNTER — Ambulatory Visit
Admission: RE | Admit: 2022-09-15 | Discharge: 2022-09-15 | Disposition: A | Discharge status: Home / Self Care | Payer: Medicare PPO | Source: Ambulatory Visit | Attending: Family Medicine | Admitting: Family Medicine

## 2022-09-15 DIAGNOSIS — Z1231 Encounter for screening mammogram for malignant neoplasm of breast: Secondary | ICD-10-CM | POA: Diagnosis present

## 2023-03-20 ENCOUNTER — Ambulatory Visit: Payer: Medicare PPO

## 2023-03-20 DIAGNOSIS — Z8371 Family history of adenomatous and serrated polyps: Secondary | ICD-10-CM | POA: Diagnosis not present

## 2023-03-20 DIAGNOSIS — Z1211 Encounter for screening for malignant neoplasm of colon: Secondary | ICD-10-CM | POA: Diagnosis not present

## 2023-08-15 ENCOUNTER — Other Ambulatory Visit: Payer: Self-pay | Admitting: Family Medicine

## 2023-08-15 DIAGNOSIS — Z1231 Encounter for screening mammogram for malignant neoplasm of breast: Secondary | ICD-10-CM

## 2023-09-18 ENCOUNTER — Other Ambulatory Visit: Payer: Self-pay | Admitting: Family Medicine

## 2023-09-18 ENCOUNTER — Ambulatory Visit
Admission: RE | Admit: 2023-09-18 | Discharge: 2023-09-18 | Disposition: A | Discharge status: Home / Self Care | Source: Ambulatory Visit | Attending: Family Medicine | Admitting: Family Medicine

## 2023-09-18 DIAGNOSIS — Z1231 Encounter for screening mammogram for malignant neoplasm of breast: Secondary | ICD-10-CM
